# Patient Record
Sex: Female | Born: 1950
Health system: Southern US, Community
[De-identification: ages and names within clinical notes are randomized; demographics above are authoritative.]

## PROBLEM LIST (undated history)

## (undated) DIAGNOSIS — E079 Disorder of thyroid, unspecified: Secondary | ICD-10-CM

## (undated) DIAGNOSIS — M1990 Unspecified osteoarthritis, unspecified site: Secondary | ICD-10-CM

## (undated) DIAGNOSIS — N189 Chronic kidney disease, unspecified: Secondary | ICD-10-CM

## (undated) DIAGNOSIS — I10 Essential (primary) hypertension: Secondary | ICD-10-CM

## (undated) DIAGNOSIS — F32A Depression, unspecified: Secondary | ICD-10-CM

## (undated) DIAGNOSIS — F329 Major depressive disorder, single episode, unspecified: Secondary | ICD-10-CM

## (undated) DIAGNOSIS — H269 Unspecified cataract: Secondary | ICD-10-CM

## (undated) DIAGNOSIS — E785 Hyperlipidemia, unspecified: Secondary | ICD-10-CM

## (undated) HISTORY — DX: Unspecified cataract: H26.9

## (undated) HISTORY — DX: Essential (primary) hypertension: I10

## (undated) HISTORY — DX: Depression, unspecified: F32.A

## (undated) HISTORY — DX: Chronic kidney disease, unspecified: N18.9

## (undated) HISTORY — DX: Disorder of thyroid, unspecified: E07.9

## (undated) HISTORY — PX: OTHER SURGICAL HISTORY: SHX169

## (undated) HISTORY — DX: Hyperlipidemia, unspecified: E78.5

## (undated) HISTORY — DX: Unspecified osteoarthritis, unspecified site: M19.90

## (undated) HISTORY — PX: CHOLECYSTECTOMY: SHX55

---
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
## 2003-12-22 ENCOUNTER — Encounter: Admission: RE | Admit: 2003-12-22 | Discharge: 2003-12-22 | Payer: Self-pay | Admitting: Family Medicine

## 2003-12-30 ENCOUNTER — Encounter: Admission: RE | Admit: 2003-12-30 | Discharge: 2003-12-30 | Payer: Self-pay | Admitting: Sports Medicine

## 2004-01-30 ENCOUNTER — Other Ambulatory Visit: Admission: RE | Admit: 2004-01-30 | Discharge: 2004-01-30 | Payer: Self-pay | Admitting: Family Medicine

## 2004-01-30 ENCOUNTER — Encounter (INDEPENDENT_AMBULATORY_CARE_PROVIDER_SITE_OTHER): Payer: Self-pay | Admitting: Specialist

## 2004-01-30 ENCOUNTER — Encounter: Admission: RE | Admit: 2004-01-30 | Discharge: 2004-01-30 | Payer: Self-pay | Admitting: Sports Medicine

## 2004-03-04 ENCOUNTER — Encounter: Admission: RE | Admit: 2004-03-04 | Discharge: 2004-03-04 | Payer: Self-pay | Admitting: Sports Medicine

## 2004-11-19 ENCOUNTER — Ambulatory Visit: Payer: Self-pay | Admitting: Family Medicine

## 2004-11-25 ENCOUNTER — Encounter: Admission: RE | Admit: 2004-11-25 | Discharge: 2004-12-29 | Payer: Self-pay

## 2004-12-15 ENCOUNTER — Ambulatory Visit: Payer: Self-pay | Admitting: Family Medicine

## 2004-12-17 ENCOUNTER — Encounter: Admission: RE | Admit: 2004-12-17 | Discharge: 2004-12-17 | Payer: Self-pay | Admitting: Sports Medicine

## 2005-01-10 ENCOUNTER — Encounter (INDEPENDENT_AMBULATORY_CARE_PROVIDER_SITE_OTHER): Payer: Self-pay | Admitting: *Deleted

## 2005-01-10 LAB — CONVERTED CEMR LAB

## 2005-01-13 ENCOUNTER — Encounter (INDEPENDENT_AMBULATORY_CARE_PROVIDER_SITE_OTHER): Payer: Self-pay | Admitting: Specialist

## 2005-01-13 ENCOUNTER — Ambulatory Visit: Payer: Self-pay | Admitting: Family Medicine

## 2005-01-13 ENCOUNTER — Other Ambulatory Visit: Admission: RE | Admit: 2005-01-13 | Discharge: 2005-01-13 | Payer: Self-pay | Admitting: Family Medicine

## 2005-06-13 ENCOUNTER — Ambulatory Visit: Payer: Self-pay | Admitting: Family Medicine

## 2005-07-01 ENCOUNTER — Encounter: Admission: RE | Admit: 2005-07-01 | Discharge: 2005-07-01 | Payer: Self-pay | Admitting: Sports Medicine

## 2005-07-08 ENCOUNTER — Ambulatory Visit (HOSPITAL_COMMUNITY): Admission: RE | Admit: 2005-07-08 | Discharge: 2005-07-08 | Payer: Self-pay | Admitting: Gastroenterology

## 2005-07-26 ENCOUNTER — Ambulatory Visit (HOSPITAL_COMMUNITY): Admission: RE | Admit: 2005-07-26 | Discharge: 2005-07-26 | Payer: Self-pay | Admitting: Gastroenterology

## 2005-10-05 ENCOUNTER — Ambulatory Visit (HOSPITAL_COMMUNITY): Admission: RE | Admit: 2005-10-05 | Discharge: 2005-10-05 | Payer: Self-pay | Admitting: Surgery

## 2005-10-05 ENCOUNTER — Encounter (INDEPENDENT_AMBULATORY_CARE_PROVIDER_SITE_OTHER): Payer: Self-pay | Admitting: Specialist

## 2006-01-25 ENCOUNTER — Ambulatory Visit (HOSPITAL_COMMUNITY): Admission: RE | Admit: 2006-01-25 | Discharge: 2006-01-25 | Payer: Self-pay | Admitting: Gastroenterology

## 2006-01-25 ENCOUNTER — Encounter (INDEPENDENT_AMBULATORY_CARE_PROVIDER_SITE_OTHER): Payer: Self-pay | Admitting: Specialist

## 2006-04-03 ENCOUNTER — Encounter: Admission: RE | Admit: 2006-04-03 | Discharge: 2006-04-03 | Payer: Self-pay | Admitting: Sports Medicine

## 2006-04-03 ENCOUNTER — Ambulatory Visit: Payer: Self-pay | Admitting: Family Medicine

## 2006-04-22 IMAGING — CR DG THORACIC SPINE 3V
3 series · 3 of 3 positions shown · non-contrast
Comparison: none

CLINICAL DATA: Pain.  No trauma. 
 THORACIC SPINE WITH SWIMMER?S ? 3 VIEW:
 Three views of the thoracic spine were obtained.  Only mild degenerative change is present with some anterior osteophyte formation in the mid to lower thoracic spine.  No compression deformity is seen.  Alignment is normal.

[t t-spine a.p.]
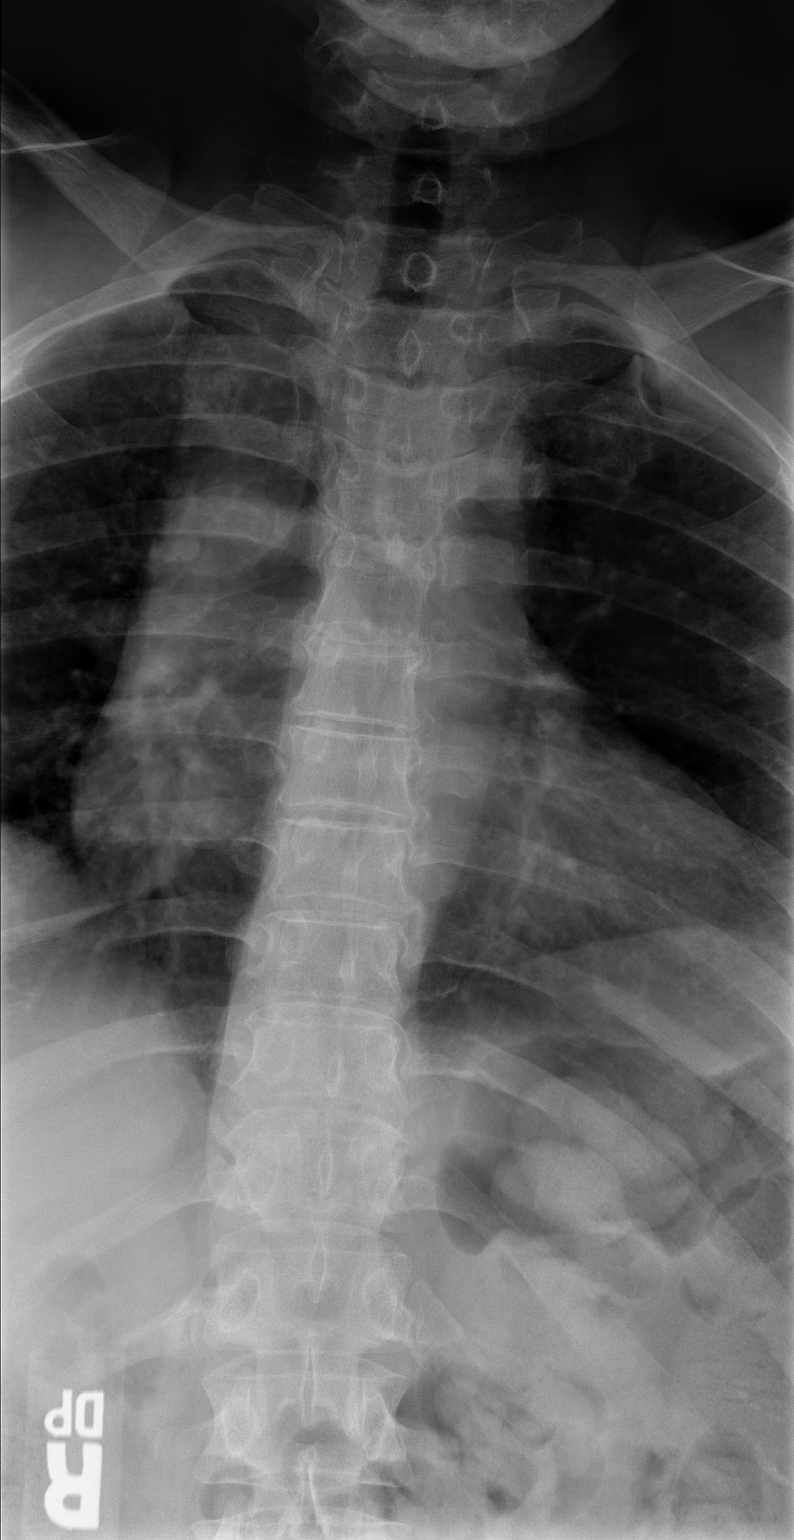

[t t-spine lat]
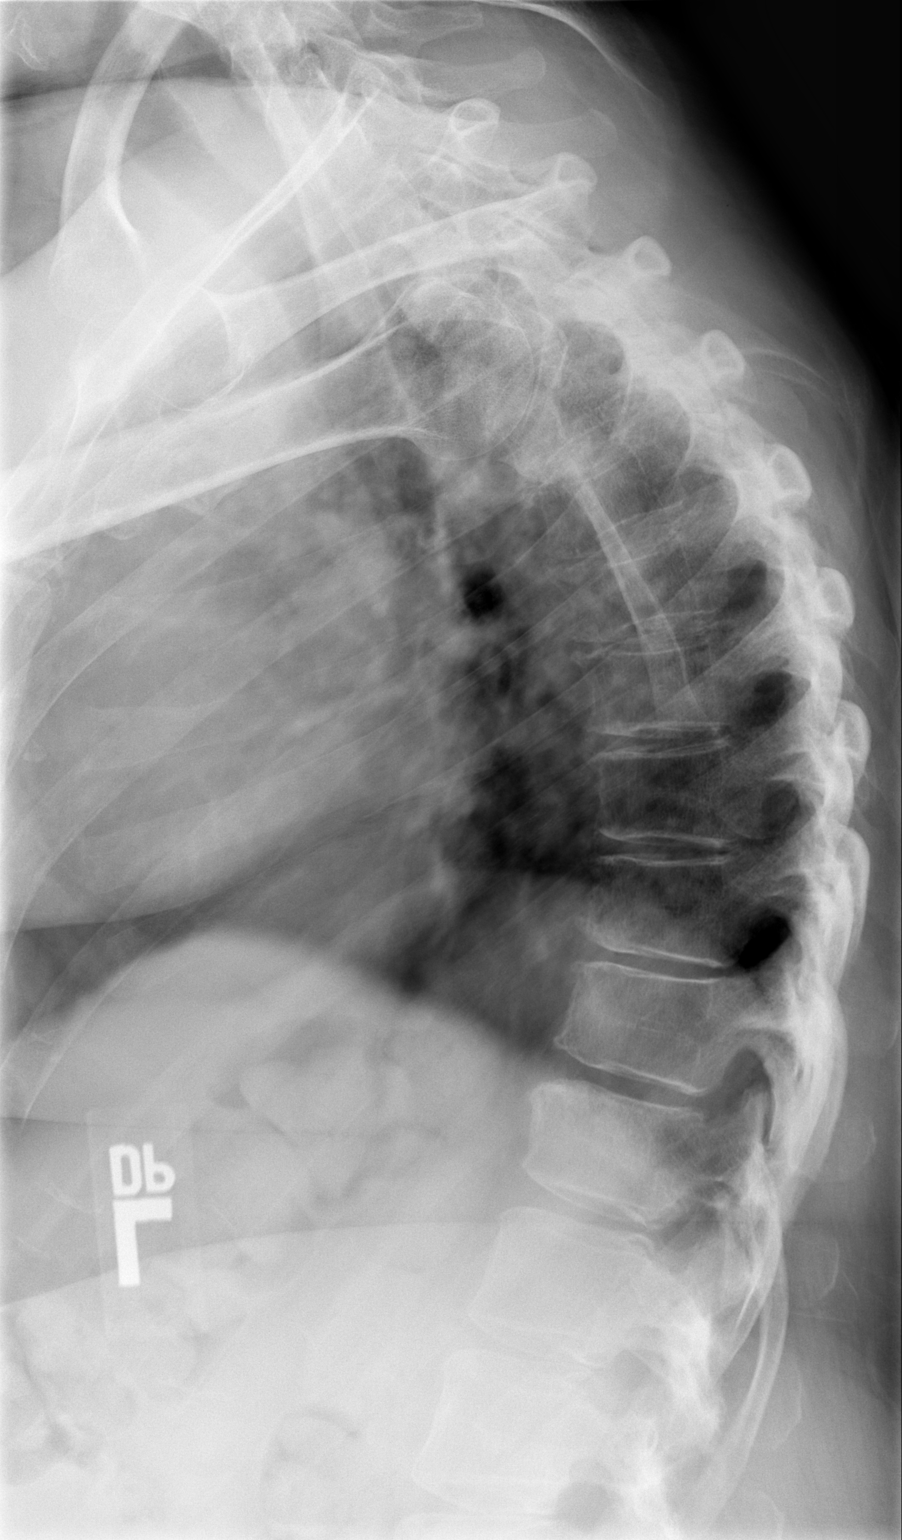

[t swimmers]
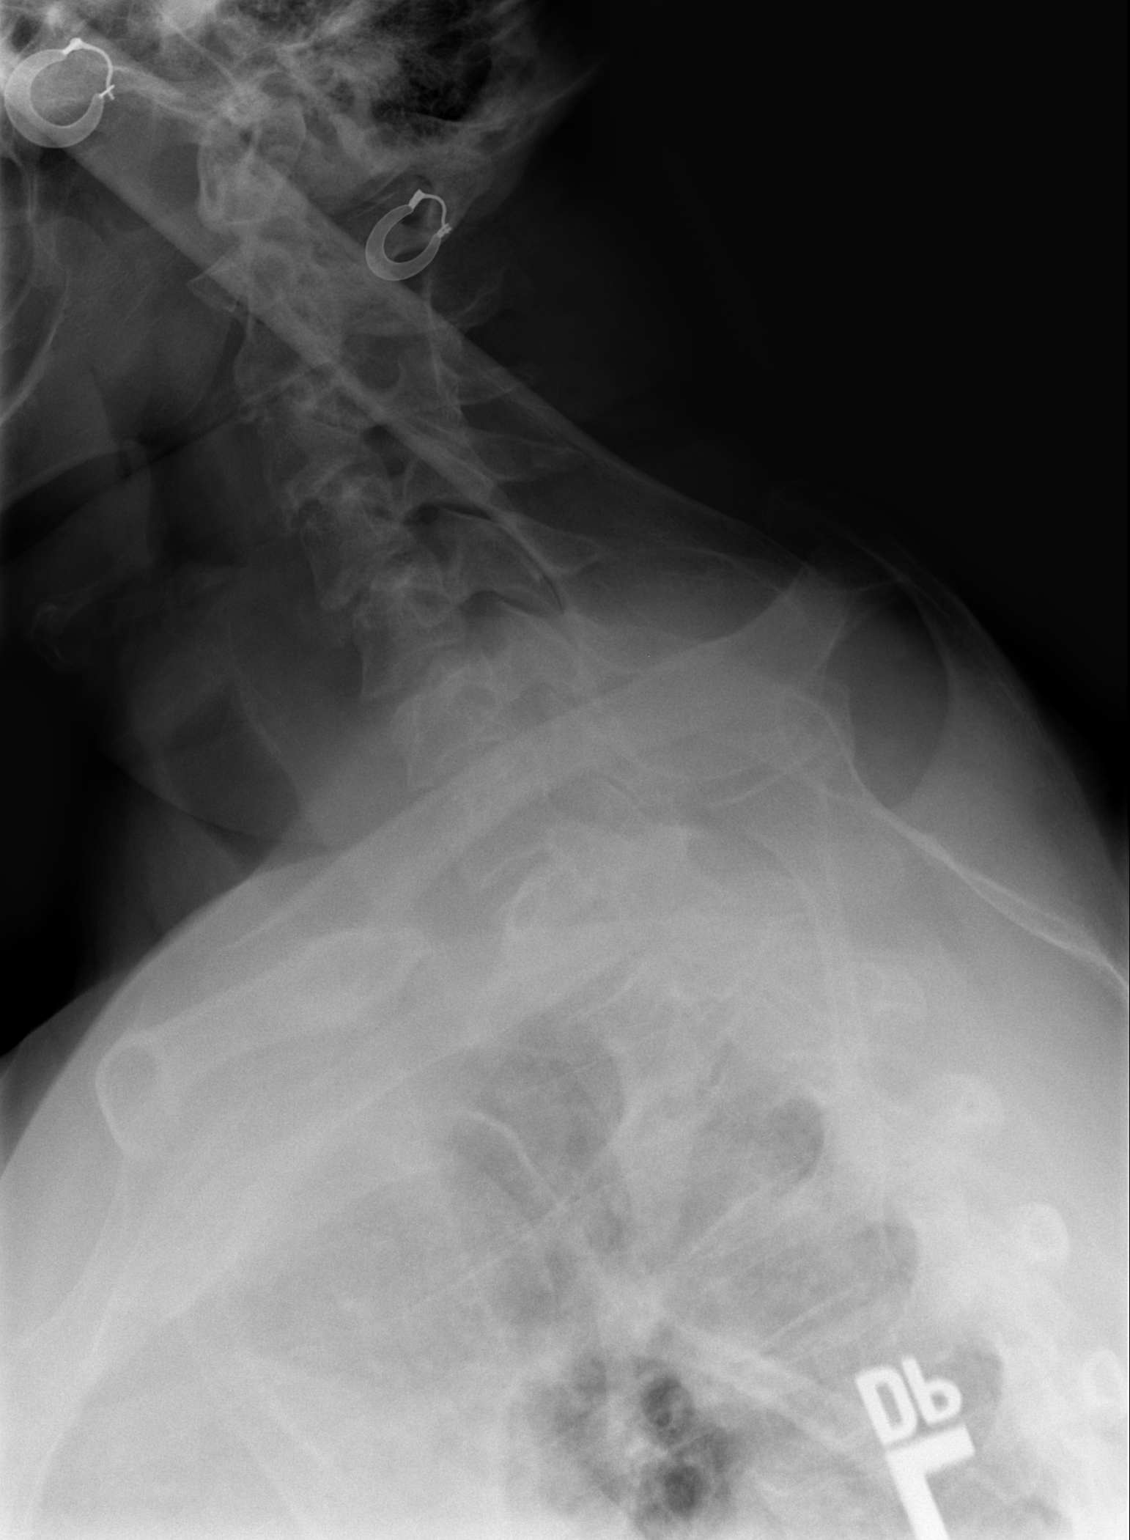

[3 of 3 positions shown; findings below may reference images not displayed]

IMPRESSION: Mild degenerative change.

## 2006-11-09 DIAGNOSIS — E669 Obesity, unspecified: Secondary | ICD-10-CM

## 2006-11-09 DIAGNOSIS — M545 Low back pain, unspecified: Secondary | ICD-10-CM | POA: Insufficient documentation

## 2006-11-09 DIAGNOSIS — F17200 Nicotine dependence, unspecified, uncomplicated: Secondary | ICD-10-CM

## 2006-11-09 DIAGNOSIS — F339 Major depressive disorder, recurrent, unspecified: Secondary | ICD-10-CM

## 2006-11-09 DIAGNOSIS — K219 Gastro-esophageal reflux disease without esophagitis: Secondary | ICD-10-CM

## 2006-11-09 DIAGNOSIS — I10 Essential (primary) hypertension: Secondary | ICD-10-CM

## 2006-11-10 ENCOUNTER — Encounter (INDEPENDENT_AMBULATORY_CARE_PROVIDER_SITE_OTHER): Payer: Self-pay | Admitting: *Deleted

## 2006-11-11 IMAGING — US US ABDOMEN COMPLETE
1 series · 14 of 25 positions shown · non-contrast
Comparison: none

CLINICAL DATA: abdominal pain
 ABDOMEN ULTRASOUND:
TECHNIQUE: Complete abdominal ultrasound examination was performed including evaluation of the liver, gallbladder, bile ducts, pancreas, kidneys, spleen, IVC, and abdominal aorta.

[Series 1: unknown · 0.34mm/px · 14 of 49 slices shown]
[im 1/49]
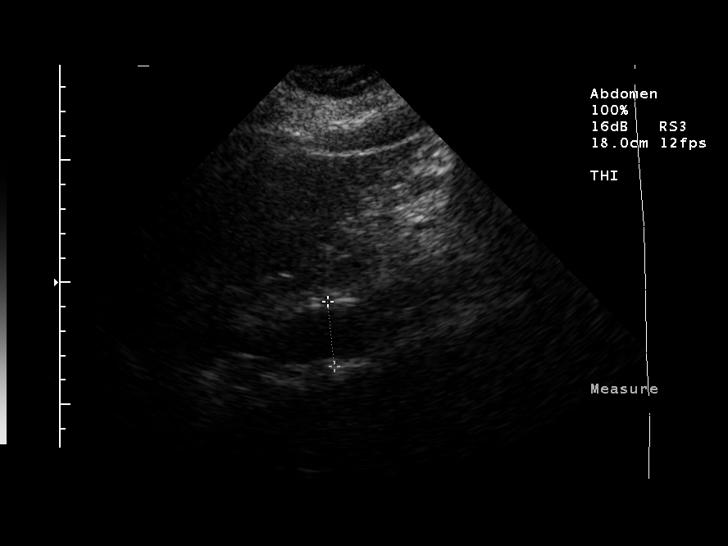
[im 5/49]
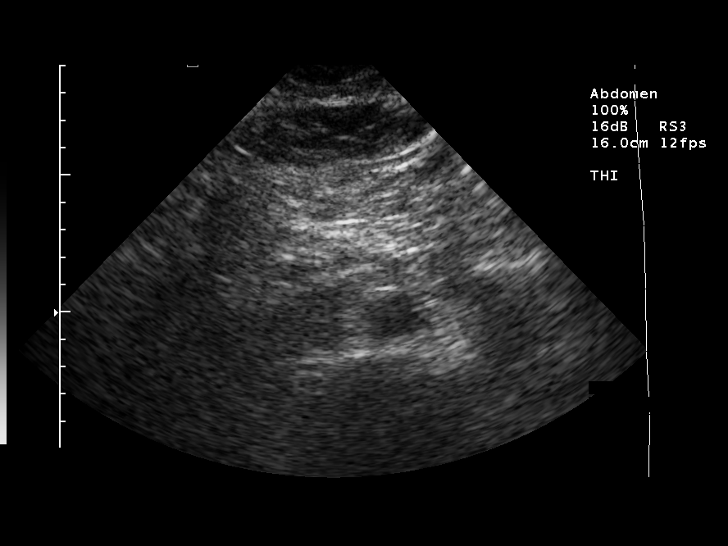
[im 9/49]
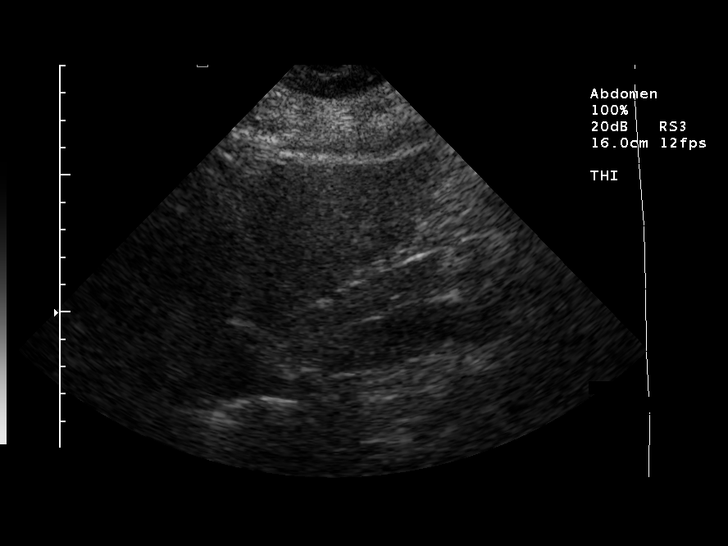
[im 13/49]
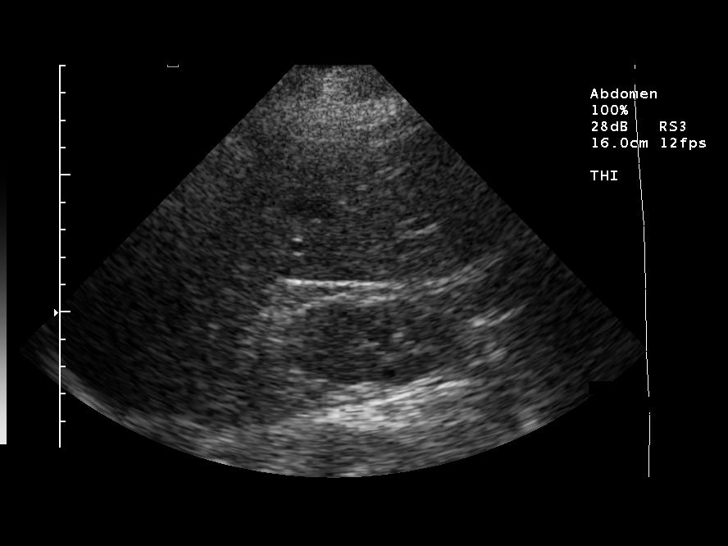
[im 17/49]
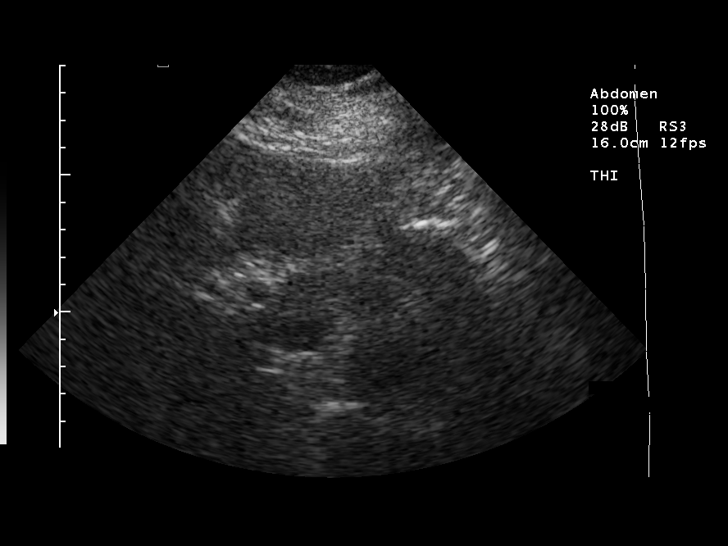
[im 19/49]
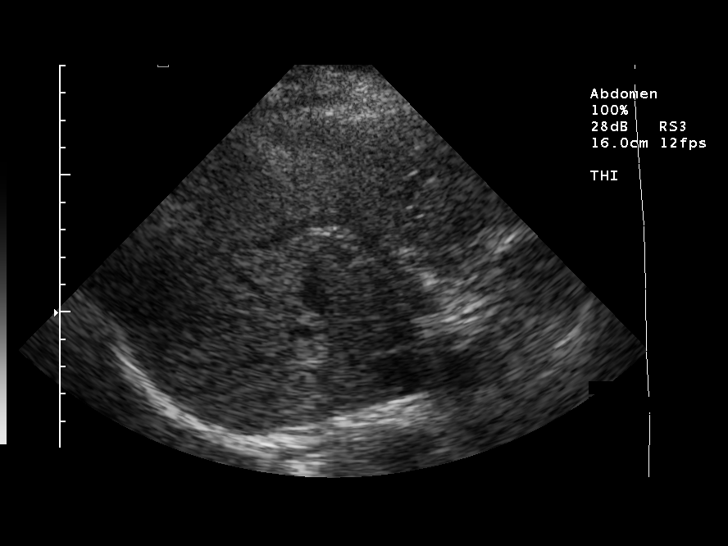
[im 23/49]
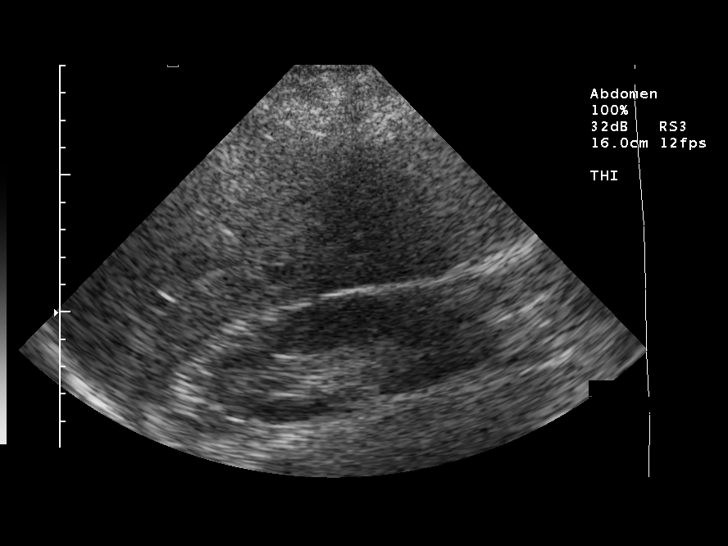
[im 27/49]
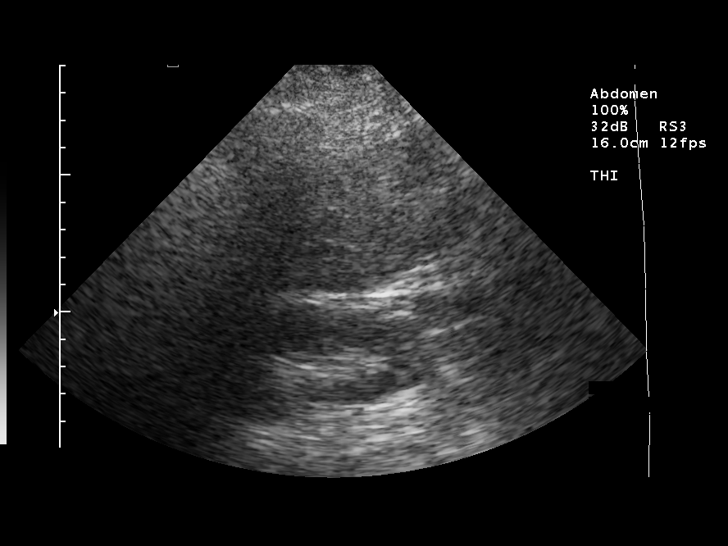
[im 31/49]
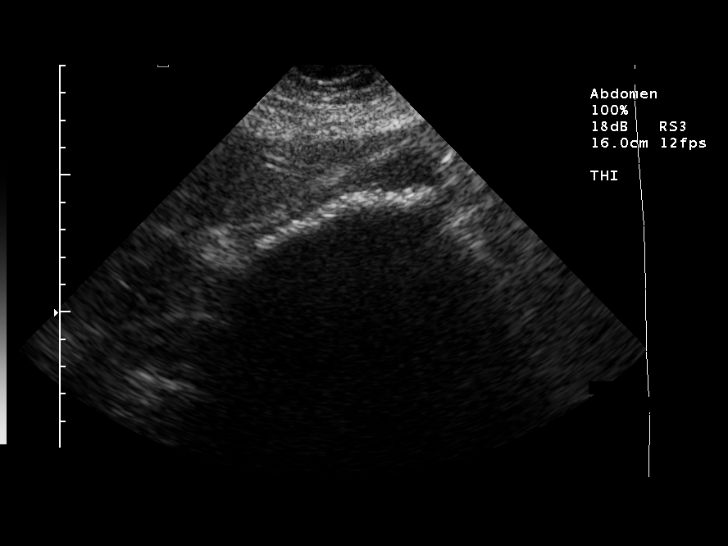
[im 33/49]
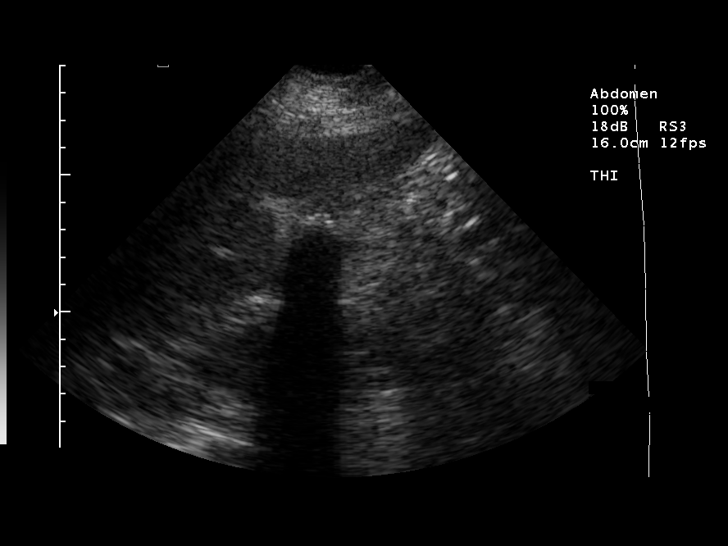
[im 37/49]
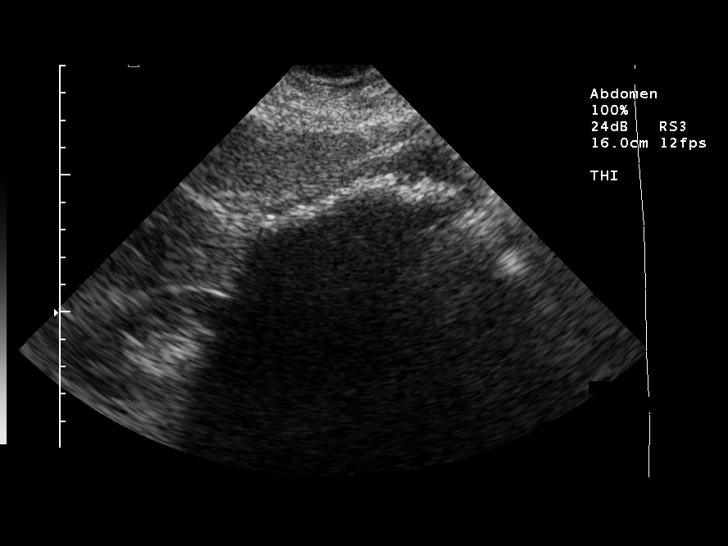
[im 41/49]
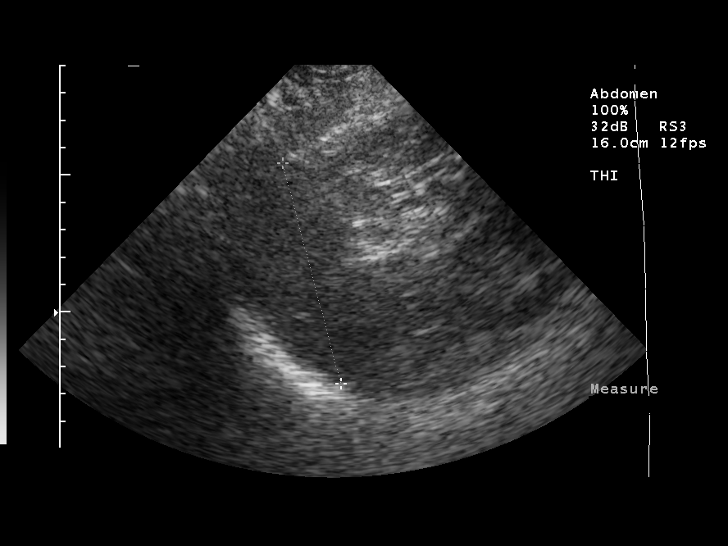
[im 45/49]
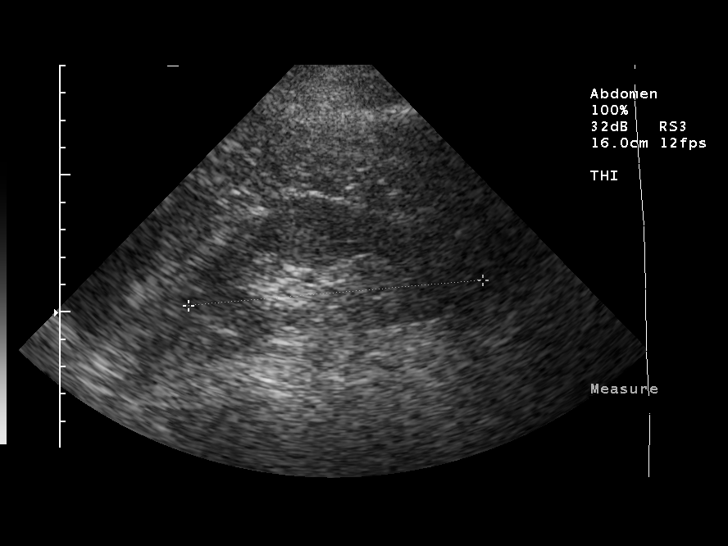
[im 49/49]
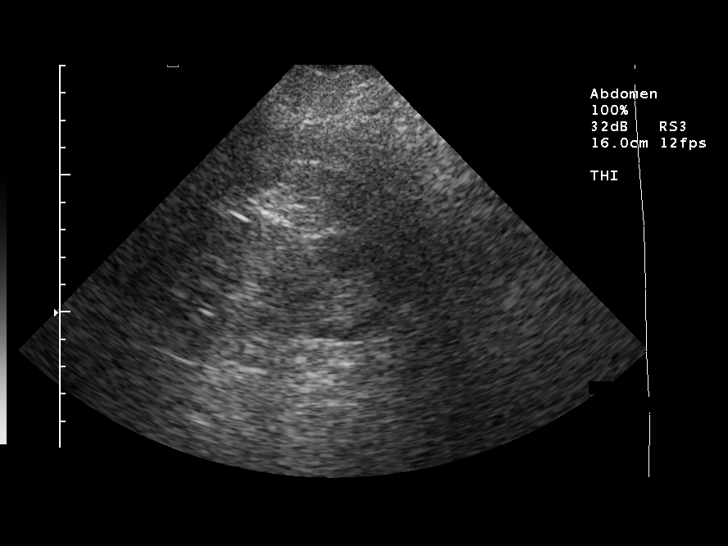

[14 of 25 positions shown; findings below may reference images not displayed]

FINDINGS: Multiple scans of the entire abdomen are made and show the gallbladder to be filled with stones with prominent acoustical shadowing.  Wall thickness is increased and measures 3.2mm.  Common bile duct is slightly prominent at 7.5mm.  The liver and inferior vena cava appear normal.  The pancreas is poorly seen due to large amounts of overlying gas.
 Spleen appears normal and measures 8.3cm in length.  The right kidney is normal and measures 10.9cm.   The left kidney is normal and measures 10.8cm.  
 The aorta measures 2.7cm in maximum diameter and is normal.
IMPRESSION: Gallbladder shows a large number of stones with marked shadowing in the area of the gallbladder.  The gallbladder wall is thickened at 3.2mm.  Common bile duct is somewhat prominent at 7.5mm but no definite stone or other obstruction is seen.  The pancreas is poorly seen due to overlying gas.

## 2007-06-10 ENCOUNTER — Encounter (INDEPENDENT_AMBULATORY_CARE_PROVIDER_SITE_OTHER): Payer: Self-pay | Admitting: Family Medicine

## 2007-06-13 ENCOUNTER — Telehealth (INDEPENDENT_AMBULATORY_CARE_PROVIDER_SITE_OTHER): Payer: Self-pay | Admitting: Family Medicine

## 2007-07-23 ENCOUNTER — Ambulatory Visit: Payer: Self-pay | Admitting: Sports Medicine

## 2007-07-23 ENCOUNTER — Ambulatory Visit (HOSPITAL_COMMUNITY): Admission: RE | Admit: 2007-07-23 | Discharge: 2007-07-23 | Payer: Self-pay | Admitting: Family Medicine

## 2007-07-23 ENCOUNTER — Encounter (INDEPENDENT_AMBULATORY_CARE_PROVIDER_SITE_OTHER): Payer: Self-pay | Admitting: Family Medicine

## 2007-07-23 DIAGNOSIS — R51 Headache: Secondary | ICD-10-CM

## 2007-07-23 DIAGNOSIS — R519 Headache, unspecified: Secondary | ICD-10-CM | POA: Insufficient documentation

## 2007-07-27 DIAGNOSIS — N183 Chronic kidney disease, stage 3 (moderate): Secondary | ICD-10-CM

## 2007-07-27 DIAGNOSIS — N1832 Chronic kidney disease, stage 3b: Secondary | ICD-10-CM | POA: Insufficient documentation

## 2007-07-27 LAB — CONVERTED CEMR LAB
BUN: 22 mg/dL (ref 6–23)
CO2: 27 meq/L (ref 19–32)
Calcium: 9.7 mg/dL (ref 8.4–10.5)
Chloride: 103 meq/L (ref 96–112)
Creatinine, Ser: 2.33 mg/dL — ABNORMAL HIGH (ref 0.40–1.20)
Glucose, Bld: 88 mg/dL (ref 70–99)
Potassium: 3.2 meq/L — ABNORMAL LOW (ref 3.5–5.3)
Sodium: 141 meq/L (ref 135–145)

## 2007-08-03 ENCOUNTER — Encounter (INDEPENDENT_AMBULATORY_CARE_PROVIDER_SITE_OTHER): Payer: Self-pay | Admitting: Family Medicine

## 2007-08-14 ENCOUNTER — Encounter (INDEPENDENT_AMBULATORY_CARE_PROVIDER_SITE_OTHER): Payer: Self-pay | Admitting: Family Medicine

## 2007-08-27 ENCOUNTER — Encounter (INDEPENDENT_AMBULATORY_CARE_PROVIDER_SITE_OTHER): Payer: Self-pay | Admitting: Family Medicine

## 2007-08-27 ENCOUNTER — Ambulatory Visit: Payer: Self-pay | Admitting: Family Medicine

## 2007-08-27 DIAGNOSIS — R002 Palpitations: Secondary | ICD-10-CM | POA: Insufficient documentation

## 2007-08-27 LAB — CONVERTED CEMR LAB
BUN: 19 mg/dL (ref 6–23)
CO2: 26 meq/L (ref 19–32)
Calcium: 9.8 mg/dL (ref 8.4–10.5)
Chloride: 101 meq/L (ref 96–112)
Creatinine, Ser: 1.86 mg/dL — ABNORMAL HIGH (ref 0.40–1.20)
Glucose, Bld: 74 mg/dL (ref 70–99)
Potassium: 3.2 meq/L — ABNORMAL LOW (ref 3.5–5.3)
Sodium: 140 meq/L (ref 135–145)
TSH: 5.999 microintl units/mL — ABNORMAL HIGH (ref 0.350–5.50)

## 2007-09-04 ENCOUNTER — Encounter (INDEPENDENT_AMBULATORY_CARE_PROVIDER_SITE_OTHER): Payer: Self-pay

## 2007-12-07 ENCOUNTER — Encounter (INDEPENDENT_AMBULATORY_CARE_PROVIDER_SITE_OTHER): Payer: Self-pay | Admitting: *Deleted

## 2008-02-11 ENCOUNTER — Telehealth (INDEPENDENT_AMBULATORY_CARE_PROVIDER_SITE_OTHER): Payer: Self-pay | Admitting: Family Medicine

## 2008-03-11 ENCOUNTER — Encounter (INDEPENDENT_AMBULATORY_CARE_PROVIDER_SITE_OTHER): Payer: Self-pay | Admitting: Family Medicine

## 2008-03-11 ENCOUNTER — Ambulatory Visit: Payer: Self-pay | Admitting: Family Medicine

## 2008-03-11 DIAGNOSIS — R21 Rash and other nonspecific skin eruption: Secondary | ICD-10-CM

## 2008-03-11 LAB — CONVERTED CEMR LAB
ALT: 9 units/L (ref 0–35)
AST: 11 units/L (ref 0–37)
Albumin: 4.1 g/dL (ref 3.5–5.2)
Alkaline Phosphatase: 115 units/L (ref 39–117)
BUN: 17 mg/dL (ref 6–23)
CO2: 24 meq/L (ref 19–32)
Calcium: 10.1 mg/dL (ref 8.4–10.5)
Chloride: 105 meq/L (ref 96–112)
Cholesterol: 245 mg/dL — ABNORMAL HIGH (ref 0–200)
Creatinine, Ser: 1.57 mg/dL — ABNORMAL HIGH (ref 0.40–1.20)
Glucose, Bld: 81 mg/dL (ref 70–99)
HDL: 41 mg/dL (ref >39)
LDL Cholesterol: 166 mg/dL — ABNORMAL HIGH (ref 0–99)
Potassium: 3.7 meq/L (ref 3.5–5.3)
Sodium: 142 meq/L (ref 135–145)
TSH: 5.344 microintl units/mL (ref 0.350–5.50)
Total Bilirubin: 0.4 mg/dL (ref 0.3–1.2)
Total CHOL/HDL Ratio: 6
Total Protein: 8.4 g/dL — ABNORMAL HIGH (ref 6.0–8.3)
Triglycerides: 189 mg/dL — ABNORMAL HIGH (ref <150)
VLDL: 38 mg/dL (ref 0–40)

## 2008-03-18 ENCOUNTER — Telehealth: Payer: Self-pay | Admitting: *Deleted

## 2008-03-19 ENCOUNTER — Encounter (INDEPENDENT_AMBULATORY_CARE_PROVIDER_SITE_OTHER): Payer: Self-pay | Admitting: Family Medicine

## 2008-03-20 ENCOUNTER — Telehealth (INDEPENDENT_AMBULATORY_CARE_PROVIDER_SITE_OTHER): Payer: Self-pay | Admitting: Family Medicine

## 2008-03-21 ENCOUNTER — Ambulatory Visit (HOSPITAL_COMMUNITY): Admission: RE | Admit: 2008-03-21 | Discharge: 2008-03-21 | Payer: Self-pay | Admitting: Family Medicine

## 2008-04-02 ENCOUNTER — Encounter (INDEPENDENT_AMBULATORY_CARE_PROVIDER_SITE_OTHER): Payer: Self-pay | Admitting: *Deleted

## 2008-06-12 ENCOUNTER — Encounter (INDEPENDENT_AMBULATORY_CARE_PROVIDER_SITE_OTHER): Payer: Self-pay | Admitting: Family Medicine

## 2008-10-01 ENCOUNTER — Encounter (INDEPENDENT_AMBULATORY_CARE_PROVIDER_SITE_OTHER): Payer: Self-pay | Admitting: Family Medicine

## 2008-10-01 ENCOUNTER — Ambulatory Visit: Payer: Self-pay | Admitting: Family Medicine

## 2008-10-03 ENCOUNTER — Ambulatory Visit (HOSPITAL_COMMUNITY): Admission: RE | Admit: 2008-10-03 | Discharge: 2008-10-03 | Payer: Self-pay | Admitting: Family Medicine

## 2008-10-06 ENCOUNTER — Encounter (INDEPENDENT_AMBULATORY_CARE_PROVIDER_SITE_OTHER): Payer: Self-pay | Admitting: Family Medicine

## 2008-10-06 LAB — CONVERTED CEMR LAB
ALT: 8 units/L (ref 0–35)
AST: 9 units/L (ref 0–37)
Albumin: 3.5 g/dL (ref 3.5–5.2)
Alkaline Phosphatase: 91 units/L (ref 39–117)
BUN: 12 mg/dL (ref 6–23)
CO2: 25 meq/L (ref 19–32)
Calcium: 8.8 mg/dL (ref 8.4–10.5)
Chloride: 102 meq/L (ref 96–112)
Creatinine, Ser: 1.81 mg/dL — ABNORMAL HIGH (ref 0.40–1.20)
Glucose, Bld: 95 mg/dL (ref 70–99)
Potassium: 3.7 meq/L (ref 3.5–5.3)
Sodium: 141 meq/L (ref 135–145)
TSH: 8.512 microintl units/mL — ABNORMAL HIGH (ref 0.350–4.50)
Total Bilirubin: 0.5 mg/dL (ref 0.3–1.2)
Total Protein: 7.6 g/dL (ref 6.0–8.3)

## 2008-10-07 LAB — CONVERTED CEMR LAB
Free T4: 0.84 ng/dL — ABNORMAL LOW (ref 0.89–1.80)
T3, Free: 2.1 pg/mL — ABNORMAL LOW (ref 2.3–4.2)

## 2009-02-06 ENCOUNTER — Ambulatory Visit: Payer: Self-pay | Admitting: Family Medicine

## 2009-02-06 DIAGNOSIS — E039 Hypothyroidism, unspecified: Secondary | ICD-10-CM | POA: Insufficient documentation

## 2009-06-11 ENCOUNTER — Encounter: Payer: Self-pay | Admitting: Family Medicine

## 2009-06-11 ENCOUNTER — Ambulatory Visit: Payer: Self-pay | Admitting: Family Medicine

## 2009-06-11 LAB — CONVERTED CEMR LAB
Free T4: 0.42 ng/dL — ABNORMAL LOW (ref 0.80–1.80)
TSH: 49.3 microintl units/mL — ABNORMAL HIGH (ref 0.350–4.500)

## 2009-06-17 ENCOUNTER — Telehealth: Payer: Self-pay | Admitting: *Deleted

## 2009-07-01 ENCOUNTER — Encounter (INDEPENDENT_AMBULATORY_CARE_PROVIDER_SITE_OTHER): Payer: Self-pay | Admitting: *Deleted

## 2009-09-28 ENCOUNTER — Emergency Department (HOSPITAL_COMMUNITY): Admission: EM | Admit: 2009-09-28 | Discharge: 2009-09-29 | Payer: Self-pay | Admitting: Emergency Medicine

## 2009-09-28 ENCOUNTER — Ambulatory Visit: Payer: Self-pay | Admitting: Family Medicine

## 2009-09-28 DIAGNOSIS — R079 Chest pain, unspecified: Secondary | ICD-10-CM

## 2009-09-28 DIAGNOSIS — E049 Nontoxic goiter, unspecified: Secondary | ICD-10-CM | POA: Insufficient documentation

## 2009-09-29 ENCOUNTER — Encounter: Payer: Self-pay | Admitting: Family Medicine

## 2009-09-29 ENCOUNTER — Encounter (INDEPENDENT_AMBULATORY_CARE_PROVIDER_SITE_OTHER): Payer: Self-pay | Admitting: Emergency Medicine

## 2009-09-29 ENCOUNTER — Ambulatory Visit: Payer: Self-pay | Admitting: Internal Medicine

## 2009-10-02 ENCOUNTER — Encounter: Payer: Self-pay | Admitting: Family Medicine

## 2010-01-25 ENCOUNTER — Encounter: Payer: Self-pay | Admitting: Family Medicine

## 2010-01-29 ENCOUNTER — Encounter: Payer: Self-pay | Admitting: Family Medicine

## 2010-06-18 ENCOUNTER — Encounter: Payer: Self-pay | Admitting: Family Medicine

## 2010-10-02 ENCOUNTER — Encounter: Payer: Self-pay | Admitting: Gastroenterology

## 2010-10-04 ENCOUNTER — Encounter: Payer: Self-pay | Admitting: Family Medicine

## 2010-10-12 NOTE — Miscellaneous (Signed)
  Clinical Lists Changes  Problems: Changed problem from CHRONIC KIDNEY DISEASE UNSPECIFIED (ICD-585.9) to CHRONIC KIDNEY DISEASE STAGE III (MODERATE) (ICD-585.3)

## 2010-10-12 NOTE — Miscellaneous (Signed)
  Clinical Lists Changes  Medications: Changed medication from NEXIUM 40 MG PACK (ESOMEPRAZOLE MAGNESIUM) Take one tablet daily for reflux to NEXIUM 40 MG CPDR (ESOMEPRAZOLE MAGNESIUM) 1 by mouth qd - Signed Rx of NEXIUM 40 MG CPDR (ESOMEPRAZOLE MAGNESIUM) 1 by mouth qd;  #90 x 3;  Signed;  Entered by: Denny Levy MD;  Authorized by: Denny Levy MD;  Method used: Printed then faxed to [facility] [phone] [place], [place], [place]  [address], Ph: [phone], Fax: [phone]    Prescriptions: NEXIUM 40 MG CPDR (ESOMEPRAZOLE MAGNESIUM) 1 by mouth qd  #90 x 3   Entered and Authorized by:   Denny Levy MD   Signed by:   Denny Levy MD on 09/29/2009   Method used:   Printed then faxed to ...       [facility] [address] (retail)       [phone]       [place], [place]  [address]       Ph: [phone]       Fax: [phone]   RxID:   [id number]

## 2010-10-12 NOTE — Miscellaneous (Signed)
Summary: meds  Clinical Lists Changes [facility] called to say the have the triamterine/HCTZ back in stock. pt has not picked up the HCTZ yet. told them to fill the original drug & take the plain HCTZ off the list. FYI to pcp.Golden Circle RN  Jan 29, 2010 9:37 AM

## 2010-10-12 NOTE — Assessment & Plan Note (Signed)
Summary: chest pain, sent to ED   Vital Signs:  Patient profile:   60 year old female Height:      65 inches Weight:      229 pounds BMI:     38.25 BSA:     2.10 Temp:     98.4 degrees F Pulse rate:   86 / minute BP sitting:   125 / 85  Vitals Entered By: Jone Baseman CMA (September 28, 2009 10:14 AM) CC: Chest pain Is Patient Diabetic? No Pain Assessment Patient in pain? yes     Location: left shoulder Intensity: 3 Type: tightness   Primary Care Provider:  Ellery Plunk MD  CC:  Chest pain.  History of Present Illness: Pt presented for f/u of hypothyroidism last seen in October.  Pt never returned after last apt for f/u TSH.  She reports her neck mass/ goiter is getting smaller.  On ROS, pt reports chest pain that began on THursday or Friday.  She describes it as someone sitting on her chest, and a 3/10 intensity.  SHe denies any exertion that brought it on and it was unrelieved by rest.  When asked if she was emotional or stressed at the time, she reported that her house is always very stressful.  She said that it happened while she was cooking dinner.  She reports that the pain was worst on the first day but has continued and is now a 1/10 and still a pressure sensation.  She denies pain with inspiration, any SOB, any dizziness, or any vision changes.    Habits & Providers  Alcohol-Tobacco-Diet     Tobacco Status: current     Tobacco Counseling: to quit use of tobacco products     Cigarette Packs/Day: 0.5  Current Medications (verified): 1)  Triamterene-Hctz 37.5-25 Mg Caps (Triamterene-Hctz) .... Take 1 Capsule By Mouth Once A Day. 2)  Effexor Xr 75 Mg  Cp24 (Venlafaxine Hcl) .... Take One Tablet Po Daily 3)  Nexium 40 Mg Pack (Esomeprazole Magnesium) .... Take One Tablet Daily For Reflux 4)  Synthroid 100 Mcg Tabs (Levothyroxine Sodium) .... Take One Tablet Daily For Thyroid and Come Back in 6-8 Weeks To Have Labs Rechecked. 5)  Lipitor 10 Mg Tabs (Atorvastatin  Calcium) .... Take One Tablet By Mouth Q Hs For Cholesterol 6)  Lotrimin Af 1 % Crea (Clotrimazole) .... Apply To Affected Areas of Face Two Times A Day For 3 Weeks  Allergies (verified): No Known Drug Allergies  Past History:  Past Medical History: h/o abnl pap Renal insufficiency 11/08 Cr 2.33 HTN, HL, Obesity,   Review of Systems       The patient complains of chest pain.  The patient denies fever, vision loss, syncope, peripheral edema, headaches, and abdominal pain.    Physical Exam  General:  alert, well-developed, cooperative to examination, and overweight-appearing.   Head:  normocephalic and atraumatic.   Eyes:  vision grossly intact, pupils equal, pupils round, and pupils reactive to light.   Neck:  supple, enlarged thyroid gland R>L without palpable nodularity Chest Wall:  non tender to palpation Lungs:  Normal respiratory effort, chest expands symmetrically. Lungs are clear to auscultation, no crackles or wheezes. Heart:  Normal rate and regular rhythm. S1 and S2 normal without gallop, murmur, click, rub or other extra sounds. Abdomen:  Bowel sounds positive,abdomen soft and non-tender without masses, organomegaly or hernias noted. Extremities:  negative homan's sign, no leg swelling, LE symmetric, no edema Neurologic:  alert & oriented X3,  cranial nerves II-XII intact, strength normal in all extremities, sensation intact to light touch, and gait normal.   Psych:  poor historian, flat affect   Impression & Recommendations:  Problem # 1:  CHEST PAIN UNSPECIFIED (ICD-786.50) Assessment New Precepted this case and decided to send to ED for eval.  Got an EKG in clinic which was normal except for previously known PVCs.  With her symptoms, would like to r/o MI vs. PE before sending her home.  Pt in agreement with plan to go to ED.  Called charge nurse to let them know she was coming and our suspicions.  Sent Pt with EKG.  Orders: Miscellaneous Lab Charge-[facility] [id number]) 12  Lead EKG (12 Lead EKG) [facility]- Est  Level 4 ([address])  Problem # 2:  HYPOTHYROIDISM (ICD-244.9) Assessment: Unchanged unable to finish work up for this today.  previously wanted Thyroid uptake scan.  Will have pt return to discuss symptoms, get TSH and decide if imaging is necessary. Her updated medication list for this problem includes:    Synthroid 100 Mcg Tabs (Levothyroxine sodium) .Marland Kitchen... Take one tablet daily for thyroid and come back in 6-8 weeks to have labs rechecked.  Complete Medication List: 1)  Triamterene-hctz 37.5-25 Mg Caps (Triamterene-hctz) .... Take 1 capsule by mouth once a day. 2)  Effexor Xr 75 Mg Cp24 (Venlafaxine hcl) .... Take one tablet po daily 3)  Nexium 40 Mg Pack (Esomeprazole magnesium) .... Take one tablet daily for reflux 4)  Synthroid 100 Mcg Tabs (Levothyroxine sodium) .... Take one tablet daily for thyroid and come back in 6-8 weeks to have labs rechecked. 5)  Lipitor 10 Mg Tabs (Atorvastatin calcium) .... Take one tablet by mouth q hs for cholesterol 6)  Lotrimin Af 1 % Crea (Clotrimazole) .... Apply to affected areas of face two times a day for 3 weeks  Other Orders: TSH-[facility] ([id number]) Prescriptions: SYNTHROID 100 MCG TABS (LEVOTHYROXINE SODIUM) Take one tablet daily for thyroid and come back in 6-8 weeks to have labs rechecked.  #30 x 0   Entered and Authorized by:   Ellery Plunk MD   Signed by:   Ellery Plunk MD on 09/28/2009   Method used:   Faxed to ...       [facility] [address] (retail)       [phone]       [place], [place]  [address]       Ph: [phone]       Fax: [phone]   RxID:   [id number] NEXIUM 40 MG PACK (ESOMEPRAZOLE MAGNESIUM) Take one tablet daily for reflux  #90 x 3   Entered and Authorized by:   Ellery Plunk MD   Signed by:   Ellery Plunk MD on 09/28/2009   Method used:   Faxed to ...       [facility] [address] (retail)       [phone]        [place], [place]  [address]       Ph: [phone]       Fax: [phone]   RxID:   [id number] HYDROXYZINE HCL 25 MG TABS (HYDROXYZINE HCL) take one up to 3 times per day as needed for itching  #60 x 1   Entered and Authorized by:   Ellery Plunk MD   Signed by:   Ellery Plunk MD on 09/28/2009   Method used:  Print then Give to Patient   RxID:   [id number] BACTROBAN 2 % OINT (MUPIROCIN) apply to rash on buttocks two times a day  #75month x 1   Entered and Authorized by:   Ellery Plunk MD   Signed by:   Ellery Plunk MD on 09/28/2009   Method used:   Print then Give to Patient   RxID:   [id number]

## 2010-10-12 NOTE — Miscellaneous (Signed)
  Clinical Lists Changes  Medications: Removed medication of TRIAMTERENE-HCTZ 37.5-25 MG CAPS (TRIAMTERENE-HCTZ) Take 1 capsule by mouth once a day. Added new medication of HYDROCHLOROTHIAZIDE 25 MG TABS (HYDROCHLOROTHIAZIDE) 1 by mouth one time daily - Signed Rx of HYDROCHLOROTHIAZIDE 25 MG TABS (HYDROCHLOROTHIAZIDE) 1 by mouth one time daily;  #30 x 0;  Signed;  Entered by: Paula Compton MD;  Authorized by: Paula Compton MD;  Method used: Electronically to [facility] [phone] [place], [place], [place]  [address], Ph: [phone], Fax: [phone] Orders: Added new Test order of Basic Met-[facility] [id number]) - Signed    Prescriptions: HYDROCHLOROTHIAZIDE 25 MG TABS (HYDROCHLOROTHIAZIDE) 1 by mouth one time daily  #30 x 0   Entered and Authorized by:   Paula Compton MD   Signed by:   Paula Compton MD on 01/25/2010   Method used:   Electronically to        [facility] [address] (retail)       [phone]       [place], [place]  [address]       Ph: [phone]       Fax: [phone]   RxID:   [id number]  Asked by triage nurse to review prescription for Triamterene-HCTZ, after message received from [facility] that this medication is on national manufacturer back-order with no release date.  I recommend HCTZ 25mg  by mouth one time daily alone, with BMet in 2 weeks to check K+ and replete as necessary; to resume Dyazide when available again.  Orders for HCTZ and future orders for BMet placed in computer. Paula Compton MD  Jan 25, 2010 3:38 PM  no working phone number to tell pt of change.Golden Circle RN  Jan 25, 2010 3:53 PM

## 2010-10-12 NOTE — Letter (Signed)
Summary: Generic Letter  [facility]  [address]   [place], [place] [address]   Phone: [phone]  Fax: [phone]    06/18/2010  [name] [address] [place], [place]  [address]  Dear Ms. Dziuba,   I was reviewing your chart and noticed that you were referred to a Kidney doctor by Dr. Jimmey Ralph.  Were you able to make that appt?  If you need another referral, I would be happy to make one.  Please call our office and let us know either way so we can get the records.        Sincerely,   Ellery Plunk MD  Appended Document: Generic Letter mailed

## 2010-11-28 LAB — POCT CARDIAC MARKERS: Myoglobin, poc: 70.9 ng/mL (ref 12–200)

## 2010-11-28 LAB — TROPONIN I: Troponin I: 0.01 ng/mL (ref 0.00–0.06)

## 2010-11-28 LAB — DIFFERENTIAL
Basophils Absolute: 0.1 10*3/uL (ref 0.0–0.1)
Basophils Relative: 1 % (ref 0–1)
Eosinophils Absolute: 0.1 10*3/uL (ref 0.0–0.7)
Eosinophils Relative: 2 % (ref 0–5)
Lymphocytes Relative: 51 % — ABNORMAL HIGH (ref 12–46)
Lymphs Abs: 3.7 10*3/uL (ref 0.7–4.0)
Monocytes Absolute: 0.4 10*3/uL (ref 0.1–1.0)
Monocytes Relative: 5 % (ref 3–12)
Neutro Abs: 3 10*3/uL (ref 1.7–7.7)
Neutrophils Relative %: 41 % — ABNORMAL LOW (ref 43–77)

## 2010-11-28 LAB — CBC
HCT: 38.6 % (ref 36.0–46.0)
Hemoglobin: 12.9 g/dL (ref 12.0–15.0)
MCHC: 33.4 g/dL (ref 30.0–36.0)
MCV: 84.6 fL (ref 78.0–100.0)
Platelets: 192 10*3/uL (ref 150–400)
RBC: 4.56 MIL/uL (ref 3.87–5.11)
RDW: 14.8 % (ref 11.5–15.5)
WBC: 7.3 10*3/uL (ref 4.0–10.5)

## 2010-11-28 LAB — POCT I-STAT, CHEM 8
Hemoglobin: 13.9 g/dL (ref 12.0–15.0)
Potassium: 3.8 mEq/L (ref 3.5–5.1)
Sodium: 140 mEq/L (ref 135–145)
TCO2: 33 mmol/L (ref 0–100)

## 2010-11-28 LAB — CK TOTAL AND CKMB (NOT AT ARMC)
CK, MB: 0.8 ng/mL (ref 0.3–4.0)
Relative Index: INVALID (ref 0.0–2.5)
Total CK: 75 U/L (ref 7–177)

## 2010-11-28 LAB — D-DIMER, QUANTITATIVE: D-Dimer, Quant: 0.86 ug/mL-FEU — ABNORMAL HIGH (ref 0.00–0.48)

## 2010-12-06 ENCOUNTER — Ambulatory Visit (INDEPENDENT_AMBULATORY_CARE_PROVIDER_SITE_OTHER): Payer: Self-pay | Admitting: Family Medicine

## 2010-12-06 ENCOUNTER — Encounter: Payer: Self-pay | Admitting: Family Medicine

## 2010-12-06 VITALS — BP 152/89 | HR 108 | Temp 99.0°F | Wt 239.2 lb

## 2010-12-06 DIAGNOSIS — E039 Hypothyroidism, unspecified: Secondary | ICD-10-CM

## 2010-12-06 DIAGNOSIS — H5210 Myopia, unspecified eye: Secondary | ICD-10-CM

## 2010-12-06 DIAGNOSIS — R51 Headache: Secondary | ICD-10-CM

## 2010-12-06 DIAGNOSIS — E049 Nontoxic goiter, unspecified: Secondary | ICD-10-CM

## 2010-12-06 DIAGNOSIS — F339 Major depressive disorder, recurrent, unspecified: Secondary | ICD-10-CM

## 2010-12-06 DIAGNOSIS — I10 Essential (primary) hypertension: Secondary | ICD-10-CM

## 2010-12-06 MED ORDER — ESOMEPRAZOLE MAGNESIUM 40 MG PO CPDR: 40.0000 mg | DELAYED_RELEASE_CAPSULE | Freq: Every day | ORAL | Status: DC

## 2010-12-06 MED ORDER — TRIAMTERENE-HCTZ 37.5-25 MG PO TABS: 1.0000 | ORAL_TABLET | Freq: Every day | ORAL | Status: DC

## 2010-12-06 MED ORDER — LEVOTHYROXINE SODIUM 100 MCG PO TABS: 100.0000 ug | ORAL_TABLET | Freq: Every day | ORAL | Status: DC

## 2010-12-06 MED ORDER — VENLAFAXINE HCL ER 75 MG PO CP24: 75.0000 mg | ORAL_CAPSULE | Freq: Every day | ORAL | Status: DC

## 2010-12-06 NOTE — Assessment & Plan Note (Signed)
Taking effexor.  Still reporting low energy.  I think thyroid problem is definitely a contributing factor. Will reassess with thyroid treatmetn

## 2010-12-06 NOTE — Assessment & Plan Note (Signed)
Has not been taking synthroid.  Recheck TSH, restart synthroid today.  See back in 6 weeks.  U/s of thyroid

## 2010-12-06 NOTE — Assessment & Plan Note (Signed)
Back of neck headaches.  Likely muscle spasm.  Advised heat, motrin, massage.  Will reeval in 6 weeks

## 2010-12-06 NOTE — Patient Instructions (Signed)
Please come back and see me in 6 weeks I am sending you for imaging of your thyroid gland Please start synthroid i will refill your other meds We will send in a referrral to the eye doctor

## 2010-12-06 NOTE — Assessment & Plan Note (Signed)
Elevated today.  Taking maxzide.  Will recheck in 6 weeks at next visit and consider increase dose.  Recheck BMET today

## 2010-12-06 NOTE — Assessment & Plan Note (Signed)
Hx of myopia.  Has had same glasses for 20 years.  No insurance.  Blurry vision worsening.  Will put in referral for project access

## 2010-12-06 NOTE — Progress Notes (Signed)
  Subjective:    Patient ID: [name], female    DOB: July 16, 1951, 60 y.o.   MRN: [id number]  HPI  Pt presents for BP follow up.  No CP, palpitations.  Reporting dry skin, constipation.  Not taking synthroid medication b/c she thought she was told to stop.  Never had thyroid imaged.  Denies fevers, is having some chills and always feels cold.  Weight gain.    HA- having 1 month of posterior neck pain worse in AM and in afternoon.  Taking motrin BID.  Had migraines but these are different.  No weakness or numbness Not smoking currently Review of Systems See above    Objective:   Physical Exam    Vital signs reviewed General appearance - sluggish with speech and movement,  in no distress and oriented to person, place, and time Chest - clear to auscultation, no wheezes, rales or rhonchi, symmetric air entry, no tachypnea, retractions or cyanosis Heart - normal rate, regular rhythm, normal S1, S2, no murmurs, rubs, clicks or gallops MSK-  Paraspinal muscles and trap muscles tight and tender     Assessment & Plan:

## 2010-12-07 ENCOUNTER — Ambulatory Visit (HOSPITAL_COMMUNITY)
Admission: RE | Admit: 2010-12-07 | Discharge: 2010-12-07 | Disposition: A | Discharge status: Home / Self Care | Payer: Self-pay | Source: Ambulatory Visit | Attending: Family Medicine | Admitting: Family Medicine

## 2010-12-07 DIAGNOSIS — E049 Nontoxic goiter, unspecified: Secondary | ICD-10-CM | POA: Insufficient documentation

## 2010-12-07 LAB — BASIC METABOLIC PANEL
BUN: 18 mg/dL (ref 6–23)
Calcium: 9.3 mg/dL (ref 8.4–10.5)
Creat: 2.06 mg/dL — ABNORMAL HIGH (ref 0.40–1.20)

## 2010-12-07 LAB — TSH: TSH: 19.331 u[IU]/mL — ABNORMAL HIGH (ref 0.350–4.500)

## 2010-12-08 ENCOUNTER — Telehealth: Payer: Self-pay | Admitting: Family Medicine

## 2010-12-08 NOTE — Telephone Encounter (Signed)
I have tried to reach pt to discuss result of ultrasound.  I discussed with Dr. Gery Pray (radiology).  He thinks that this was a benign appearing u/s and typical for a thyroid treated with synthroid.  He did not think further evaluation was necessary.  I would like her to take her synthroid and follow up with me as previously discussed.

## 2011-01-10 ENCOUNTER — Other Ambulatory Visit: Payer: Self-pay

## 2011-01-10 DIAGNOSIS — I10 Essential (primary) hypertension: Secondary | ICD-10-CM

## 2011-01-10 DIAGNOSIS — E039 Hypothyroidism, unspecified: Secondary | ICD-10-CM

## 2011-01-10 LAB — BASIC METABOLIC PANEL
BUN: 18 mg/dL (ref 6–23)
CO2: 26 mEq/L (ref 19–32)
Calcium: 9.7 mg/dL (ref 8.4–10.5)
Chloride: 104 mEq/L (ref 96–112)
Creat: 1.98 mg/dL — ABNORMAL HIGH (ref 0.40–1.20)
Glucose, Bld: 95 mg/dL (ref 70–99)
Potassium: 3.4 mEq/L — ABNORMAL LOW (ref 3.5–5.3)
Sodium: 141 mEq/L (ref 135–145)

## 2011-01-10 LAB — TSH: TSH: 6.555 u[IU]/mL — ABNORMAL HIGH (ref 0.350–4.500)

## 2011-01-10 NOTE — Progress Notes (Signed)
Bmp and tsh done [facility] Nabor Thomann

## 2011-01-13 ENCOUNTER — Telehealth: Payer: Self-pay | Admitting: Family Medicine

## 2011-01-13 ENCOUNTER — Encounter: Payer: Self-pay | Admitting: Family Medicine

## 2011-01-13 NOTE — Telephone Encounter (Signed)
Number is disconnected.  Pt needs to confirm dose of synthroid and will need to increase.  If pt calls in, please have her make appt.  I will send her a letter to this effect.

## 2011-01-14 ENCOUNTER — Telehealth: Payer: Self-pay | Admitting: *Deleted

## 2011-01-14 NOTE — Telephone Encounter (Signed)
Pt has appt @ [facility] for 5.16.12 @ 10:45.  Will mail her letter received from [facility]. Jervon Ream, Maryjo [place]

## 2011-01-28 NOTE — Op Note (Signed)
[name], [name]              ACCOUNT NO.:  [id number]   MEDICAL RECORD NO.:  [id number]          PATIENT TYPE:  AMB   LOCATION:  ENDO                         FACILITY:  [facility]   PHYSICIAN:  Shirley Friar, MDDATE OF BIRTH:  [birth year]   DATE OF PROCEDURE:  07/08/2005  DATE OF DISCHARGE:                                 OPERATIVE REPORT   PROCEDURE:  Colonoscopy.   INDICATIONS FOR PROCEDURE:  Screening.   ANESTHESIA:  Demerol 75 mg, Versed 6 mg.   FINDINGS:  Rectal exam was normal.  The adjustable colonoscope was inserted  and advanced through a poorly prepped colon to the cecum where the ileocecal  valve and appendiceal orifice were identified.  The terminal ileum was  intubated and was normal in appearance.  On careful withdrawal of the  colonoscope, no large lesions were seen but due to the patient's poor prep,  unable to adequately visualize the colon.  The colonoscope was withdrawn and  confirmed the above findings.   ASSESSMENT:  Poorly prepped colon.   PLAN:  Repeat colonoscopy in 4-6 months with one gallon GoLYTELY in order to  adequately visualize the colon.      Shirley Friar, MD  Electronically Signed     VCS/MEDQ  D:  07/08/2005  T:  07/08/2005  Job:  [id number]   cc:   Devra Dopp, MD  Fax: [phone]

## 2011-01-28 NOTE — Op Note (Signed)
[name], [name]              ACCOUNT NO.:  [id number]   MEDICAL RECORD NO.:  [id number]          PATIENT TYPE:  AMB   LOCATION:  DAY                          FACILITY:  [facility]   PHYSICIAN:  Wilmon Arms. Corliss Skains, M.D. DATE OF BIRTH:  [birth year]   DATE OF PROCEDURE:  10/05/2005  DATE OF DISCHARGE:                                 OPERATIVE REPORT   PREOPERATIVE DIAGNOSIS:  Chronic calculus cholecystitis.   POSTOPERATIVE DIAGNOSIS:  Chronic calculus cholecystitis.   PROCEDURE PERFORMED:  Laparoscopic cholecystectomy with intraoperative  cholangiogram   SURGEON:  Dr. Corliss Skains   ASSESSMENT:  Dr. Colin Benton   ANESTHESIA:  General endotracheal.   INDICATIONS:  The patient is a 60 year old female who presented on referral  from Dr. Charlott Rakes with complaints of right upper quadrant pain for  over a year.  This is usually postprandial and is associated with nausea,  bloating, and belching.  This pain radiates into her back.  The patient  underwent an ultrasound of the right upper quadrant which showed a mildly  thickened gallbladder wall with multiple gallstones.  She had another one a  few weeks later which showed no wall thickening but multiple stones.  Her  liver function tests were normal.  She was referred for consultation, and we  recommended laparoscopic cholecystectomy.   DESCRIPTION OF PROCEDURE:  The patient brought to the operating room, placed  in supine position operating table.  After an adequate general endotracheal  anesthesia was obtained, a time-out was taken to ensure the proper patient  and proper procedure.  The patient's abdomen was prepped with Betadine and  draped in a sterile fashion.  A vertical incision was made just below  umbilicus after infiltrating with 0.25% Marcaine.  Dissection was carried  down to the fascia bluntly.  The fascia was divided in vertical fashion and  grasped with Kocher clamps.  The peritoneal cavity was bluntly entered.  A  stay  sutures of 0 Vicryl was placed in circumferential pursestring fashion.  The Hasson cannula was inserted and secured with stay sutures.  Pneumoperitoneum was obtained by insufflating CO2 maintaining maximal  pressure of 15 mmHg.  The patient was positioned in reverse Trendelenburg  position, rotated slightly to her left.  The laparoscope was inserted, and a  quick examination of the abdomen showed no abnormalities.  There was some  omentum adherent to the posterior umbilicus.  No bowel was involved.  A 10  mm port was placed in subxiphoid position.  Two 5 mm ports were placed in  the right upper quadrant.  The gallbladder was grasped with clamps and  elevated over the edge of the liver.  The gallbladder was very large and  obviously contained stones.  There were adhesions to the surface of the  gallbladder.  These were taken down with cautery.  The peritoneum over the  cystic duct was opened.  The cystic duct was circumferentially dissected.  The duct was then ligated distally with a clip.  A small opening was created  on the cystic duct.  A Cook cholangiogram catheter was then inserted through  a stab  incision.  This was threaded into the cystic duct and secured with a  clip.  A cholangiogram was then obtained which showed a small, tortuous  cystic duct but good flow into the proximal and distal biliary tree with no  filling defects.  There was good flow through the duodenum.  The  cholangiogram catheter was then removed.  The cystic duct was then ligated  with clips and divided.  The cystic artery was also ligated with clips and  divided.  Cautery was then used to dissect the gallbladder free from the  liver bed.  Hemostasis was obtained with Bovie cautery.  The gallbladder was  placed in an EndoCatch sack.  The gallbladder fossa was then thoroughly  irrigated with saline.  No further bleeding was noted.  The gallbladder in  the EndoCatch sack was then removed through the umbilical  incision.  The  pursestring suture was used to close the fascia.  The right upper quadrant  was then thoroughly inspected.  Hemostasis was felt to be adequate.  The  cannulas were removed while the pneumoperitoneum was released.  Then 4-0  Monocryl was used to close the skin in subcuticular fashion.  Steri-Strips  and clean dressings were applied.  All sponge, instrument, and needle counts  were correct.  The patient was extubated and brought to recovery in stable  condition.      Wilmon Arms. Tsuei, M.D.  Electronically Signed     MKT/MEDQ  D:  10/05/2005  T:  10/06/2005  Job:  [id number]   cc:   Erin Friar, MD  Fax: [phone]

## 2011-01-28 NOTE — Op Note (Signed)
[name], [name]              ACCOUNT NO.:  [id number]   MEDICAL RECORD NO.:  [id number]           PATIENT TYPE:   LOCATION:                                 FACILITY:   PHYSICIAN:  Shirley Friar, MD     DATE OF BIRTH:   DATE OF PROCEDURE:  01/25/2006  DATE OF DISCHARGE:                                 OPERATIVE REPORT   PROCEDURE:  Colonoscopy   INDICATIONS:  Screening.   MEDICATIONS:  Fentanyl 50 mcg IV, Versed 5 mg IV.   HISTORY:  A 60 year old black female who had a colonoscopy for screening  purposes in October, but had poor prep and was scheduled, again, for repeat  colonoscopy to reevaluate her colon.   FINDINGS:  Rectal exam was normal.   DESCRIPTION OF PROCEDURE:  An adult adjustable colonoscope was inserted in a  fair-prepped colon and advance into the cecum where the ileocecal valve and  appendiceal orifice were identified.  There were scattered areas of semi-  solid and liquid stool throughout her colon that had to be irrigated and  aspirated multiple times, although it may have precluded visualization of  small polyps.  An 8-mm colon polyp was noted in the descending colon.  It  was removed in piecemeal fashion with snare cautery.  This polyp was a  sessile polyp.  No other polyps or lesions were seen.  Retroflexion was  normal.   ASSISTANT:  1.  Colon polyp--status post snare chronic snare cautery.  2.  No other abnormalities seen.   PLAN:  1.  Repeat colonoscopy in 3-5 years due to polyp and fair-prep.  2.  High fiber diet.  3.  Avoid aspirin products for 2 weeks.      Shirley Friar, MD  Electronically Signed     VCS/MEDQ  D:  01/25/2006  T:  01/25/2006  Job:  [id number]

## 2011-02-01 IMAGING — CR DG CHEST 2V
2 series · 2 of 2 positions shown · non-contrast
Comparison: Chest radiograph 10/05/2005

CLINICAL DATA: Chest pain

CHEST - 2 VIEW

[w chest pa]
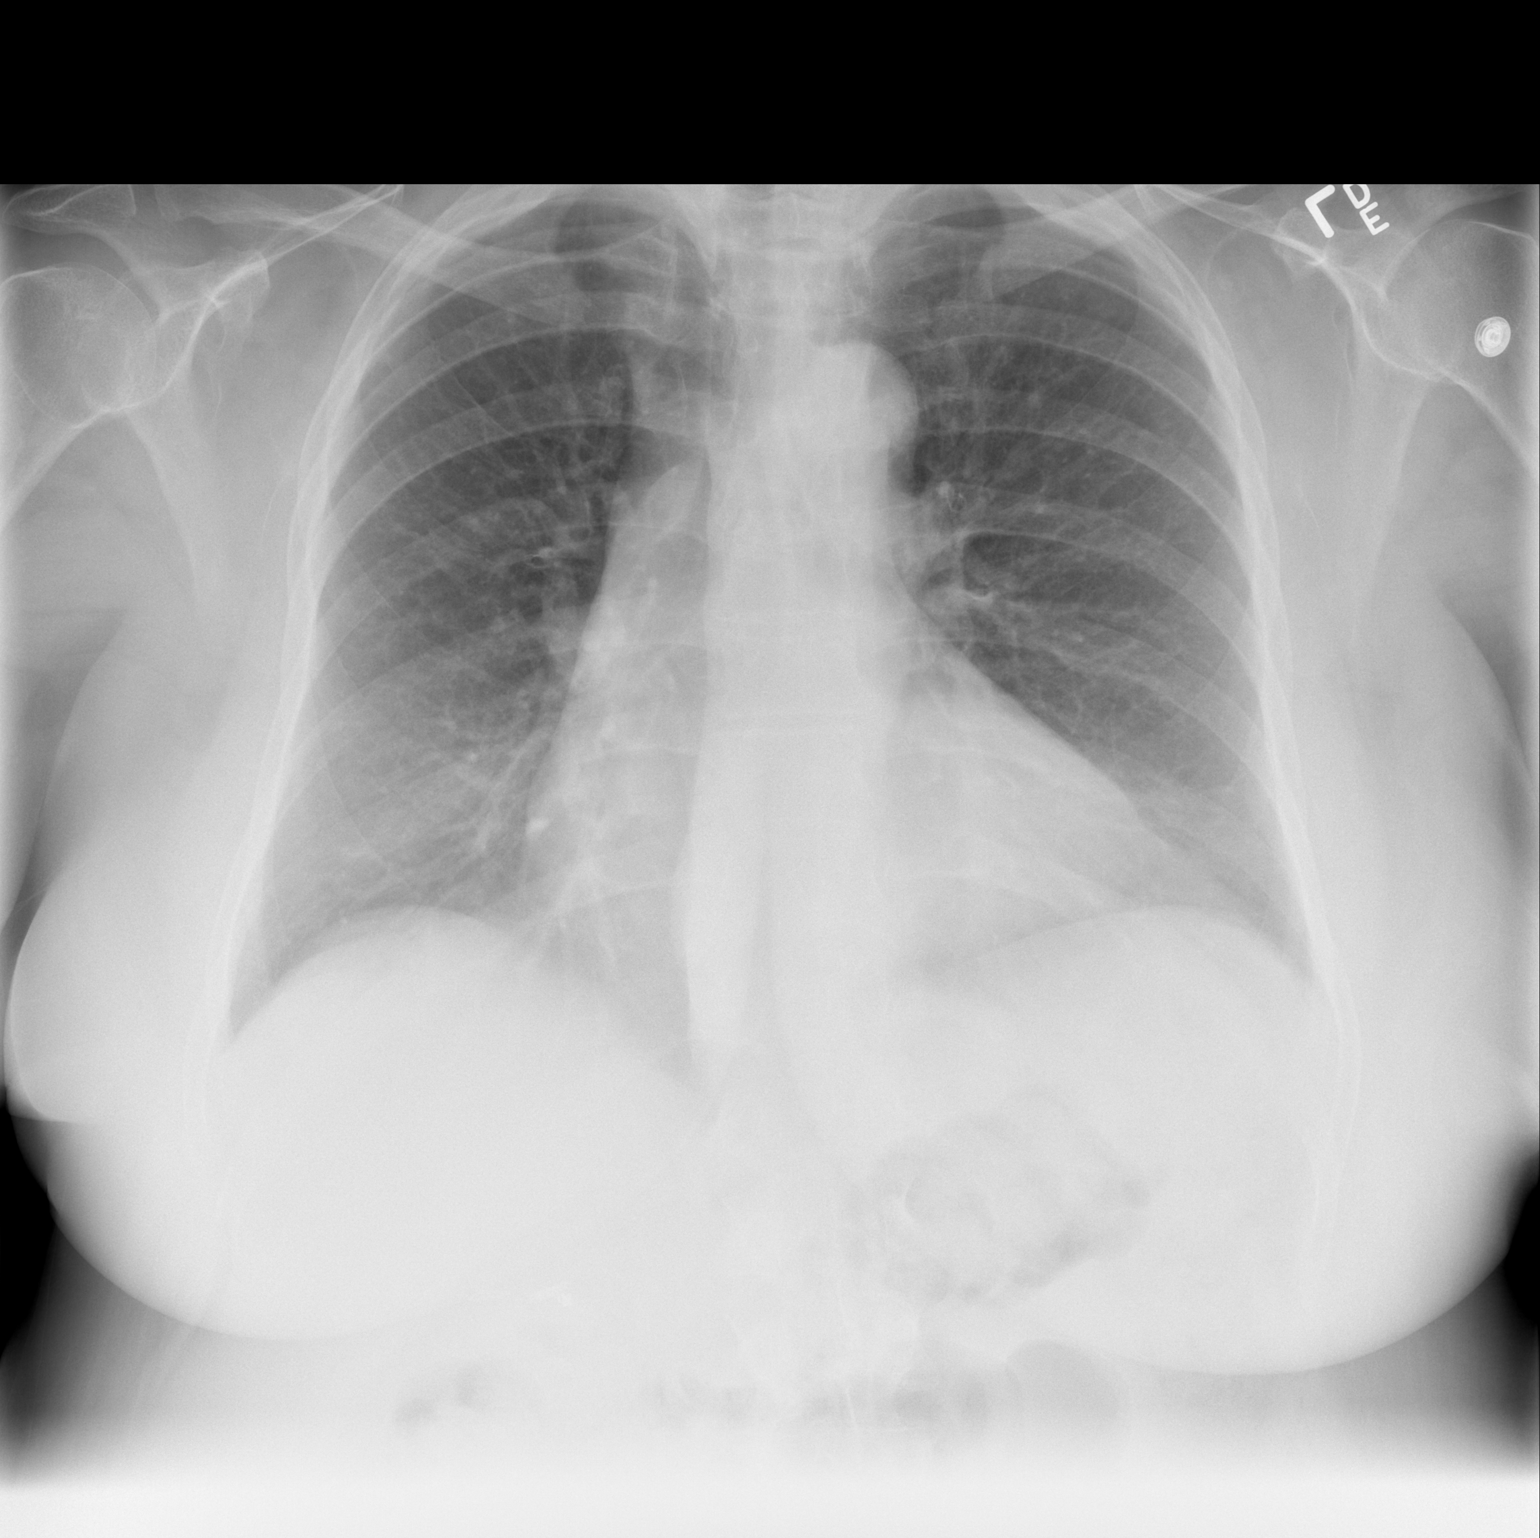

[w chest lat]
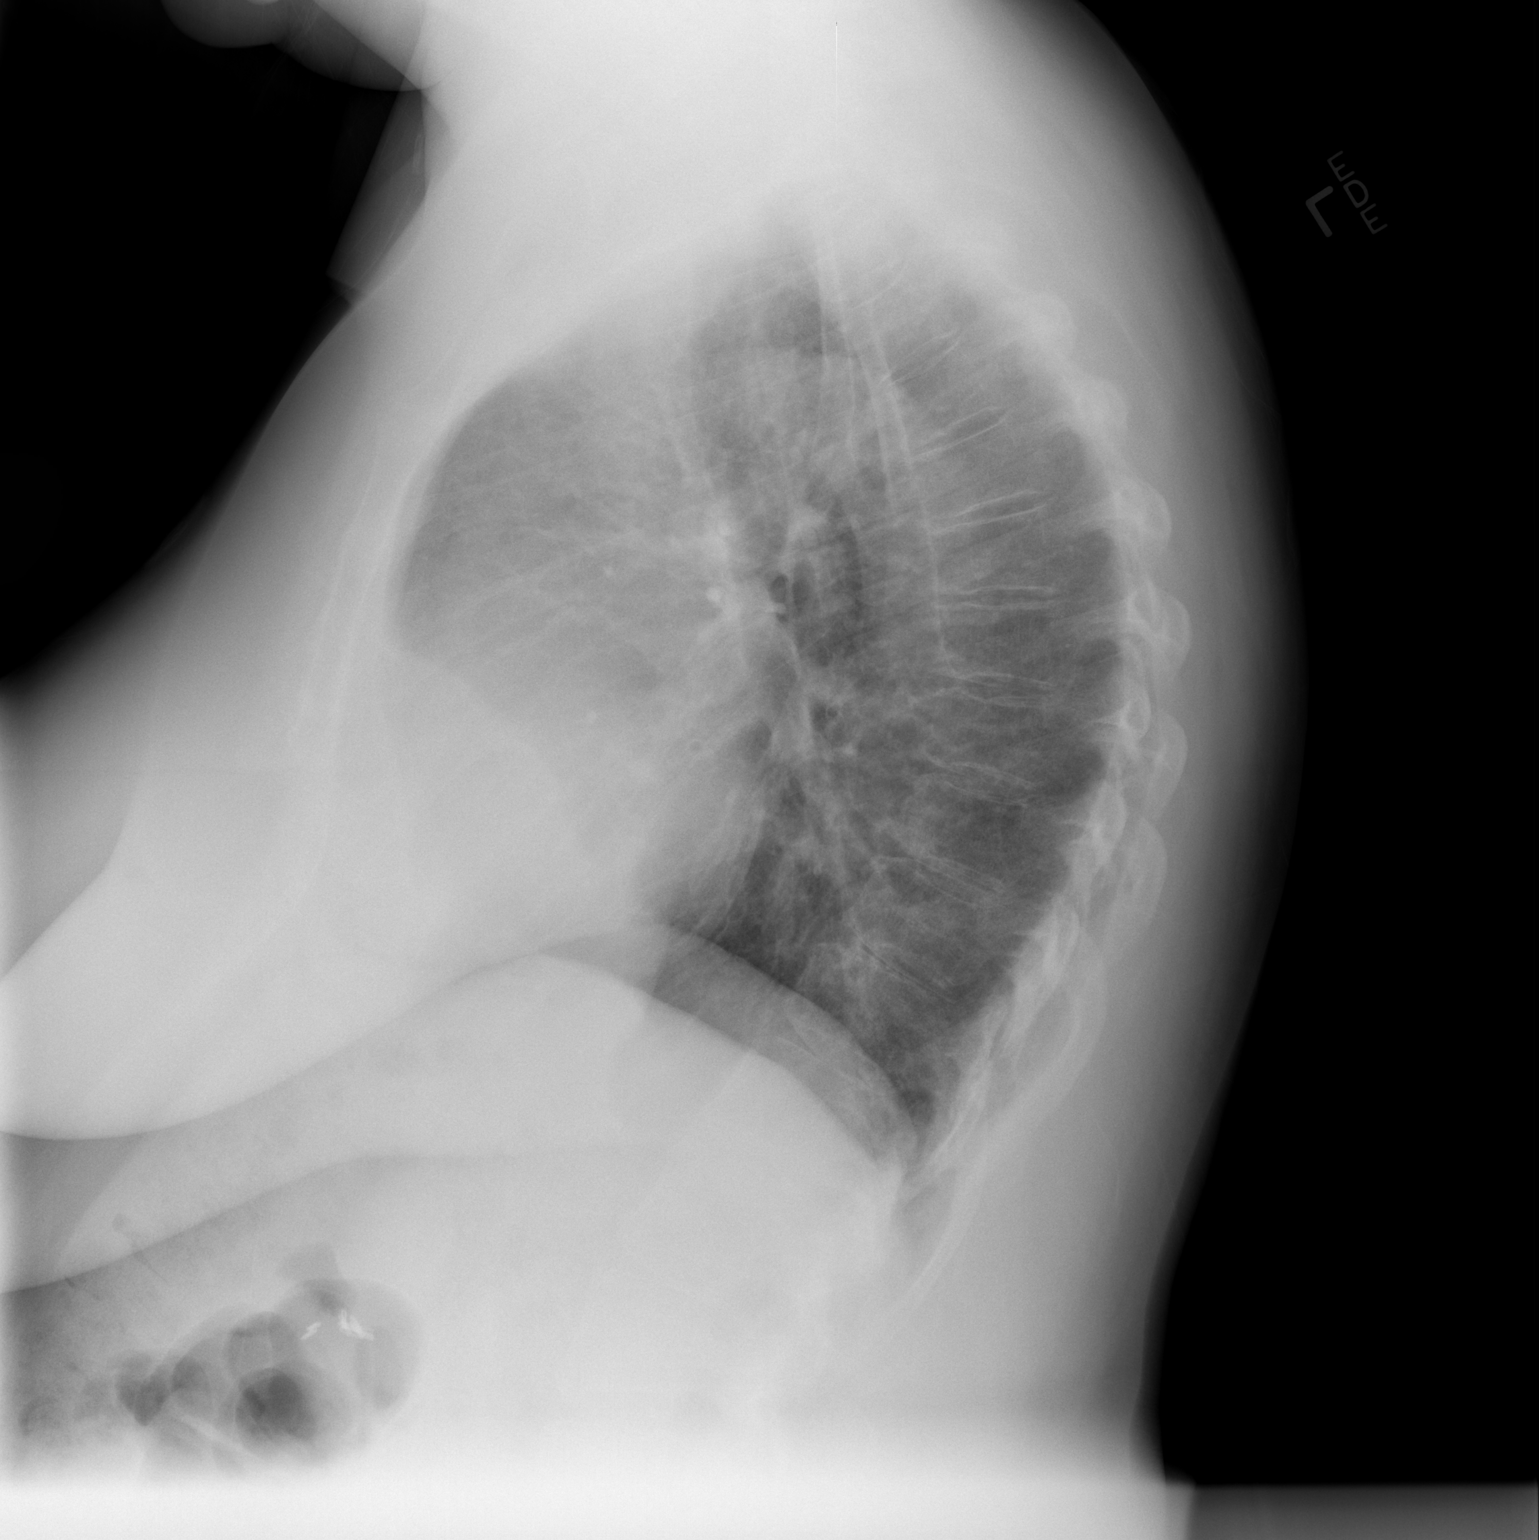

[2 of 2 positions shown; findings below may reference images not displayed]

FINDINGS: Normal mediastinum and heart silhouette.  Costophrenic
angles are clear.  No evidence effusion, infiltrate, or
pneumothorax.
IMPRESSION: No acute cardiopulmonary process.

## 2011-04-08 ENCOUNTER — Other Ambulatory Visit: Payer: Self-pay | Admitting: Family Medicine

## 2011-04-08 MED ORDER — VENLAFAXINE HCL ER 75 MG PO CP24: 75.0000 mg | ORAL_CAPSULE | Freq: Every day | ORAL | Status: DC

## 2011-05-04 ENCOUNTER — Other Ambulatory Visit: Payer: Self-pay | Admitting: Family Medicine

## 2011-05-04 MED ORDER — ATORVASTATIN CALCIUM 10 MG PO TABS: 10.0000 mg | ORAL_TABLET | Freq: Every day | ORAL | Status: DC

## 2011-05-13 ENCOUNTER — Other Ambulatory Visit: Payer: Self-pay | Admitting: Family Medicine

## 2011-05-13 MED ORDER — ATORVASTATIN CALCIUM 10 MG PO TABS: 10.0000 mg | ORAL_TABLET | Freq: Every day | ORAL | Status: DC

## 2011-11-28 ENCOUNTER — Other Ambulatory Visit: Payer: Self-pay | Admitting: Family Medicine

## 2011-11-28 MED ORDER — TRIAMTERENE-HCTZ 37.5-25 MG PO TABS: 1.0000 | ORAL_TABLET | Freq: Every day | ORAL | Status: DC

## 2012-04-03 ENCOUNTER — Other Ambulatory Visit: Payer: Self-pay | Admitting: Family Medicine

## 2012-04-03 MED ORDER — VENLAFAXINE HCL ER 75 MG PO CP24: 75.0000 mg | ORAL_CAPSULE | Freq: Every day | ORAL | Status: DC

## 2012-04-03 NOTE — Telephone Encounter (Signed)
Filled and faxed back refill request for Effexor

## 2012-04-10 ENCOUNTER — Other Ambulatory Visit: Payer: Self-pay | Admitting: Family Medicine

## 2012-04-10 MED ORDER — VENLAFAXINE HCL ER 75 MG PO CP24: 75.0000 mg | ORAL_CAPSULE | Freq: Every day | ORAL | Status: DC

## 2012-12-07 ENCOUNTER — Telehealth: Payer: Self-pay | Admitting: Family Medicine

## 2012-12-07 MED ORDER — ESOMEPRAZOLE MAGNESIUM 20 MG PO CPDR: 20.0000 mg | DELAYED_RELEASE_CAPSULE | Freq: Every day | ORAL | Status: DC

## 2012-12-07 NOTE — Telephone Encounter (Signed)
Called patient. Refilled nexium.  decreased to 20 mg daily. 30 with one refill. Patient will get f/u appt with me.   rx called into [facility] pharmacy.

## 2013-01-02 ENCOUNTER — Ambulatory Visit: Payer: No Typology Code available for payment source | Admitting: Family Medicine

## 2013-01-03 ENCOUNTER — Ambulatory Visit (INDEPENDENT_AMBULATORY_CARE_PROVIDER_SITE_OTHER): Payer: No Typology Code available for payment source | Admitting: Family Medicine

## 2013-01-03 VITALS — BP 163/98 | HR 73 | Temp 98.1°F | Resp 18 | Wt 237.0 lb

## 2013-01-03 DIAGNOSIS — M25369 Other instability, unspecified knee: Secondary | ICD-10-CM | POA: Insufficient documentation

## 2013-01-03 DIAGNOSIS — F17200 Nicotine dependence, unspecified, uncomplicated: Secondary | ICD-10-CM

## 2013-01-03 DIAGNOSIS — I10 Essential (primary) hypertension: Secondary | ICD-10-CM

## 2013-01-03 DIAGNOSIS — M25361 Other instability, right knee: Secondary | ICD-10-CM

## 2013-01-03 DIAGNOSIS — M238X9 Other internal derangements of unspecified knee: Secondary | ICD-10-CM

## 2013-01-03 DIAGNOSIS — F339 Major depressive disorder, recurrent, unspecified: Secondary | ICD-10-CM

## 2013-01-03 DIAGNOSIS — E039 Hypothyroidism, unspecified: Secondary | ICD-10-CM

## 2013-01-03 DIAGNOSIS — L600 Ingrowing nail: Secondary | ICD-10-CM | POA: Insufficient documentation

## 2013-01-03 LAB — TSH: TSH: 15.639 u[IU]/mL — ABNORMAL HIGH (ref 0.350–4.500)

## 2013-01-03 LAB — COMPREHENSIVE METABOLIC PANEL
ALT: <8 U/L (ref 0–35)
CO2: 27 mEq/L (ref 19–32)
Chloride: 108 mEq/L (ref 96–112)
Sodium: 141 mEq/L (ref 135–145)
Total Bilirubin: 0.4 mg/dL (ref 0.3–1.2)
Total Protein: 7.3 g/dL (ref 6.0–8.3)

## 2013-01-03 LAB — LIPID PANEL
Total CHOL/HDL Ratio: 5 Ratio
VLDL: 24 mg/dL (ref 0–40)

## 2013-01-03 MED ORDER — VENLAFAXINE HCL ER 75 MG PO CP24: 75.0000 mg | ORAL_CAPSULE | Freq: Every day | ORAL | Status: DC

## 2013-01-03 MED ORDER — ESOMEPRAZOLE MAGNESIUM 20 MG PO CPDR: 20.0000 mg | DELAYED_RELEASE_CAPSULE | Freq: Every day | ORAL | Status: DC

## 2013-01-03 MED ORDER — TRIAMTERENE-HCTZ 37.5-25 MG PO TABS: 1.0000 | ORAL_TABLET | Freq: Every day | ORAL | Status: DC

## 2013-01-03 MED ORDER — ASPIRIN 81 MG PO TBEC: 81.0000 mg | DELAYED_RELEASE_TABLET | Freq: Every day | ORAL | Status: DC

## 2013-01-03 NOTE — Assessment & Plan Note (Signed)
A: not ready to quit P: counseling provided  

## 2013-01-03 NOTE — Progress Notes (Signed)
Subjective:     Patient ID: [name], female   DOB: Nov 07, 1950, 62 y.o.   MRN: [id number]  HPI  1. HTN: out of meds x one month. Positive SOB and leg swelling. Negative CP, HA, vision changes.   2. Thyroid goiter: not taking thyroid hormone replacement. Cannot remember taking it. Feels compression symptoms when she bends her neck. No trouble swallowing. No change in weight, hair, skin.   3. Smoking: 1/2 PPD. Not ready to quits. Smokes and drinks coffee for stress relief.   4. Ingrown toenail: L great toe. Painful x two weeks.   5. Knees give out: R>L. No pain. Fell two weeks ago. Does not exercise regularly.   Review of Systems As per HPI     Objective:   Physical Exam BP 163/98  Pulse 73  Temp(Src) 98.1 F (36.7 C) (Oral)  Resp 18  Wt 237 lb (107.502 kg)  BMI 39.44 kg/m2  SpO2 98% General appearance: alert, cooperative and no distress Neck: no adenopathy, no carotid bruit, no JVD and thyroid: enlarged symmetrically and non tender  Lungs: clear to auscultation bilaterally Heart: regular rate and rhythm, S1, S2 normal, no murmur, click, rub or gallop Extremities: trace edema at feet. diffused toenail blackening and thickening.  L great toenail ingrown.     Assessment:

## 2013-01-03 NOTE — Assessment & Plan Note (Signed)
A: untreated.  P: check TSH and initiate therapy.

## 2013-01-03 NOTE — Assessment & Plan Note (Signed)
A: knee buckling. Suspect weakness P:  Rehab/knee strengthening with thera bands handout provided

## 2013-01-03 NOTE — Assessment & Plan Note (Signed)
A: ingrown P: recommend toenail removal. Patient informed and will schedule procedure if she desires.

## 2013-01-03 NOTE — Assessment & Plan Note (Addendum)
A: declined off meds. P: Restart maxzide Check TSH and CMP F/u in 4 weeks.

## 2013-01-03 NOTE — Patient Instructions (Addendum)
[name],  Thank you for coming in today. I will dose cholesterol medication and thyroid hormone replacement based on your blood work and fax prescription to your pharmacy.  For HTN: restart maxzide. Avoid salt (soup, canned foods, diet soda).   For knee weakness:  strengthening exercises with band.  If you have access: recumbent bike.   F/u with me in 4 weeks for BP check. If you would like your toenail removed schedule a visit for the procedure, separate visit, toenail removal only.   I will call with lab results.   Dr. Armen Pickup

## 2013-01-04 ENCOUNTER — Telehealth: Payer: Self-pay | Admitting: Family Medicine

## 2013-01-04 MED ORDER — LEVOTHYROXINE SODIUM 25 MCG PO TABS: 25.0000 ug | ORAL_TABLET | Freq: Every day | ORAL | Status: DC

## 2013-01-04 MED ORDER — ATORVASTATIN CALCIUM 10 MG PO TABS: 10.0000 mg | ORAL_TABLET | Freq: Every day | ORAL | Status: DC

## 2013-01-04 NOTE — Telephone Encounter (Signed)
Called patient. Unable to leave message. Letter sent.   Reviewed labs, elevated TSH and cholesterol.  Will start levothyroxine 25 mcg daily, recheck in 6 weeks and titrate up based on TSH.  Will start moderate to high intensity statin- lipitor 10mg  .   Faxed Rx to [facility]

## 2013-01-08 ENCOUNTER — Telehealth: Payer: Self-pay | Admitting: Family Medicine

## 2013-01-08 MED ORDER — ROSUVASTATIN CALCIUM 10 MG PO TABS: 10.0000 mg | ORAL_TABLET | Freq: Every day | ORAL | Status: DC

## 2013-01-08 NOTE — Telephone Encounter (Signed)
Received fax from [facility] pharmacy that lipitor no longer available. crestor is available Sent in crestor 10. Tried to call patient, unable to leave VM. Letter sent.

## 2013-01-21 ENCOUNTER — Encounter: Payer: Self-pay | Admitting: Family Medicine

## 2013-01-21 ENCOUNTER — Ambulatory Visit (INDEPENDENT_AMBULATORY_CARE_PROVIDER_SITE_OTHER): Payer: No Typology Code available for payment source | Admitting: Family Medicine

## 2013-01-21 ENCOUNTER — Telehealth: Payer: Self-pay | Admitting: Family Medicine

## 2013-01-21 VITALS — BP 136/89 | HR 92 | Temp 98.3°F | Ht 64.0 in | Wt 230.0 lb

## 2013-01-21 DIAGNOSIS — K219 Gastro-esophageal reflux disease without esophagitis: Secondary | ICD-10-CM

## 2013-01-21 DIAGNOSIS — B351 Tinea unguium: Secondary | ICD-10-CM

## 2013-01-21 DIAGNOSIS — L600 Ingrowing nail: Secondary | ICD-10-CM

## 2013-01-21 MED ORDER — FAMOTIDINE 20 MG PO TABS: 20.0000 mg | ORAL_TABLET | Freq: Two times a day (BID) | ORAL | Status: DC

## 2013-01-21 MED ORDER — ITRACONAZOLE 200 MG PO TABS: 1.0000 | ORAL_TABLET | Freq: Every day | ORAL | Status: AC

## 2013-01-21 MED ORDER — ITRACONAZOLE 200 MG PO TABS: 1.0000 | ORAL_TABLET | Freq: Every day | ORAL | Status: DC

## 2013-01-21 NOTE — Telephone Encounter (Signed)
Called patient Will treat onychomycosis (toenail fungus). Avoid lamisil due to CrCl < 50. Called in itraconazole for 12 week course and changed PPI to H2 blocker.

## 2013-01-21 NOTE — Assessment & Plan Note (Signed)
Will treat. Avoid lamisil due to CrCl < 50. Called in itraconazole for 12 week course and changed PPI to H2 blocker.

## 2013-01-21 NOTE — Progress Notes (Signed)
Patient ID: [name], female   DOB: [birth year], 62 y.o.   MRN: [id number] Toenail Avulsion Procedure Note  Pre-operative Diagnosis: Left Ingrown Great toenail, onychomycosis   Post-operative Diagnosis: Left Ingrown Great toenail, onychomycosis   Indications: pain   Anesthesia: Lidocaine 1% without epinephrine without added sodium bicarbonate  Procedure Details  History of allergy to iodine: no  The risks (including bleeding and infection) and benefits of the  procedure and Written informed consent obtained.  After digital block anesthesia was obtained, a tourniquet was applied for hemostasis during the procedure.  After prepping with Betadine, the lateral and medial  edge of the nail was freed from the nailbed and perionychium, and then split with scissors and removed with  forceps.  All visible granulation tissue is debrided. Antibiotic and bulky dressing was applied.   Findings: In grown toenail   Complications: none.  Plan: 1. Soak the foot once daily. Change dressing and apply antibiotic ointment once  daily until healed over. 2. Warning signs of infection were reviewed.   3. Recommended that the patient use OTC acetaminophen and OTC ibuprofen as needed for pain.  4. Return as needed.

## 2013-01-21 NOTE — Patient Instructions (Addendum)
[name],  Thank you for coming in today. I removed your ingrown toenail on your L great toe.  Apply antibiotic ointment daily until healing is complete.  The patient should be given the instruction sheet and told to take ibuprofen (Motrin) and acetaminophen (Tylenol) for postoperative pain.  Daily cleansing with warm water is encouraged. Avoid  strenuous exercise  for at least one week.  If you develop redness, swelling, foul smell drainage at the removal site please call immediately.   Dr. Armen Pickup   Toenail Removal Toenails may need to be removed because of injury, infections, or to correct abnormal growth. A special non-stick bandage will likely be put tightly on your toe to prevent bleeding. Often times a new nail will grow back. Sometimes the new nail may be deformed. Most of the time when a nail is lost, it will gradually heal, but may be sensitive for a long time. HOME CARE INSTRUCTIONS   Keep your foot elevated to relieve pain and swelling. This will require lying in bed or on a couch with the leg on pillows or sitting in a recliner with the leg up. Walking or letting your leg dangle may increase swelling, slow healing, and cause throbbing pain.  Keep your bandage dry and clean.  Change your bandage in 24 hours.  After your bandage is changed, soak your foot in warm, soapy water for 10 to 20 minutes. Do this 3 times per day. This helps reduce pain and swelling. After soaking your foot apply a clean, dry bandage. Change your bandage if it is wet or dirty.  Only take over-the-counter or prescription medicines for pain, discomfort, or fever as directed by your caregiver.  See your caregiver as needed for problems. You might need a tetanus shot now if:  You have no idea when you had the last one.  You have never had a tetanus shot before.  The injured area had dirt in it. If you need a tetanus shot, and you decide not to get one, there is a rare chance of getting tetanus.  Sickness from tetanus can be serious. If you did get a tetanus shot, your arm may swell, get red and warm to the touch at the shot site. This is common and not a problem. SEEK IMMEDIATE MEDICAL CARE IF:   You have increased pain, swelling, redness, warmth, drainage, or bleeding.  You have a fever.  You have swelling that spreads from your toe into your foot. Document Released: 05/28/2003 Document Revised: 11/21/2011 Document Reviewed: 09/08/2008 [facility] Patient Information 2013 [place], [place].

## 2013-02-01 ENCOUNTER — Ambulatory Visit (INDEPENDENT_AMBULATORY_CARE_PROVIDER_SITE_OTHER): Payer: No Typology Code available for payment source | Admitting: Family Medicine

## 2013-02-01 ENCOUNTER — Encounter: Payer: Self-pay | Admitting: Family Medicine

## 2013-02-01 VITALS — BP 136/76 | HR 86 | Temp 98.9°F | Ht 64.0 in | Wt 225.4 lb

## 2013-02-01 DIAGNOSIS — L600 Ingrowing nail: Secondary | ICD-10-CM

## 2013-02-01 DIAGNOSIS — I10 Essential (primary) hypertension: Secondary | ICD-10-CM

## 2013-02-01 MED ORDER — DOXYCYCLINE HYCLATE 100 MG PO TABS: 100.0000 mg | ORAL_TABLET | Freq: Two times a day (BID) | ORAL | Status: DC

## 2013-02-01 NOTE — Assessment & Plan Note (Signed)
A: s/p removal. Area is healing well. Throbbing and tenderness concerning for post surgical infection. P: Doxycycline x 10 day course  Continue to soak. Apply dressing post soak-band aid is appropriate  F/u in 3 weeks.

## 2013-02-01 NOTE — Progress Notes (Signed)
Subjective:     Patient ID: [name], female   DOB: 23-Sep-1950, 62 y.o.   MRN: [id number]  HPI 62 yo F presents for f/u to discuss the following;  1. S/p L great ingrown toenail removal: 2 week f/u. Patient with throbbing when standing. No fever, significant drainage, redness or swelling. She is soaking the toe in alcohol. She is not applying a dressing. Sh has not started itraconazole for onychomycosis.   2. HTN: compliant with medication. Smoking 1/2 PPD. Denies HA, CP, SOB and vision changes.   Review of Systems As per HPI    Objective:   Physical Exam BP 136/76  Pulse 86  Temp(Src) 98.9 F (37.2 C) (Oral)  Ht 5\' 4"  (1.626 m)  Wt 225 lb 6 oz (102.229 kg)  BMI 38.67 kg/m2 General appearance: alert, cooperative and no distress Lungs: clear to auscultation bilaterally Heart: regular rate and rhythm, S1, S2 normal, no murmur, click, rub or gallop Extremities: extremities normal, atraumatic, no cyanosis or edema, L great toenail tender medially. No swelling or redness. No drainage from lateral or medial wound.     Assessment and Plan:

## 2013-02-01 NOTE — Assessment & Plan Note (Signed)
A: BP at goal. P: continue current regimen.

## 2013-02-01 NOTE — Patient Instructions (Addendum)
[name],  Thank you for coming in today. Your blood pressures is at goal. Please continue maxzide.  For your foot: the wound is healing well. I am concerned about your pain. Please take doxycyline 100 mg twice daily for 10 days. Called in to [facility] dept pharmacy.  Continue to soak your foot in warm, soapy water for 10 to 20 minutes. Do this 3 times per day. This helps reduce pain and swelling. After soaking your foot apply a clean, dry band-aid.   Plan to see me in 3 more weeks for f/u.   Dr. Armen Pickup

## 2013-02-25 ENCOUNTER — Ambulatory Visit (INDEPENDENT_AMBULATORY_CARE_PROVIDER_SITE_OTHER): Payer: No Typology Code available for payment source | Admitting: Family Medicine

## 2013-02-25 VITALS — BP 128/79 | Temp 98.2°F | Wt 226.0 lb

## 2013-02-25 DIAGNOSIS — I10 Essential (primary) hypertension: Secondary | ICD-10-CM

## 2013-02-25 DIAGNOSIS — M542 Cervicalgia: Secondary | ICD-10-CM

## 2013-02-25 DIAGNOSIS — E039 Hypothyroidism, unspecified: Secondary | ICD-10-CM

## 2013-02-25 DIAGNOSIS — F17200 Nicotine dependence, unspecified, uncomplicated: Secondary | ICD-10-CM

## 2013-02-25 DIAGNOSIS — L600 Ingrowing nail: Secondary | ICD-10-CM

## 2013-02-25 MED ORDER — KETOROLAC TROMETHAMINE 60 MG/2ML IM SOLN
60.0000 mg | Freq: Once | INTRAMUSCULAR | Status: AC
  Administered 2013-02-25: 60 mg via INTRAMUSCULAR

## 2013-02-25 NOTE — Patient Instructions (Addendum)
Marcell,  Thank you for coming in today.  Her blood pressure is perfect!   For your neck pain I recommended that you ice it and continue stretches.  Please plan to follow up with me in 2 months after you've started your level thyroxine for thyroid replacement.I will need to recheck your TSH every and treated for at least 8 weeks.  F/u in 2 months.  Dr. Armen Pickup

## 2013-02-25 NOTE — Assessment & Plan Note (Signed)
A: areas feeling well despite the patient not taking the course antibiotics. P:  Patient to follow up as needed

## 2013-02-25 NOTE — Assessment & Plan Note (Signed)
Not ready to quit.  

## 2013-02-25 NOTE — Progress Notes (Signed)
Subjective:     Patient ID: [name], female   DOB: 1951-06-20, 62 y.o.   MRN: [id number]  HPI And 62 year old female presents for follow visit to discuss the following  #1 hypertension: patient is compliant with her hypertensive medication. She continues to smoke a half pack a day. She admits to intermittent headache. She denies chest pain or shortness of breath. She also denies lower extremity edema.  #2 Tobacco abuse: Patient is not ready to quit. She states that smoking is her stress relief.  #3 toenail removal: patient is now 4 weeks out from left great lateral toenails avulsion. Her two-week followup appointment she had some pain and drainage at the site. I prescribed a ten-day course of doxycycline. The patient did not take doxycycline. She reports she has continued intermittent drainage from the medial aspect of her toe. The drainage is clear. She also has occasional sharp pains were toe up to the midfoot.  #4 neck pain:Patient with neck pain for the past week. The pain initially started on the right side is dominant to the left side. The pain is sharp and radiates to left supraspinous area. The pain does not radiate down her arm there is no associated weakness.  Medication review: patient has still not started level thyroxine for hypothyroidism, Crestor, and itraconazole for onychomycosis.   Review of Systems As per HPI     Objective:   Physical Exam BP 128/79  Temp(Src) 98.2 F (36.8 C) (Oral)  Wt 226 lb (102.513 kg)  BMI 38.77 kg/m2 General appearance: alert, cooperative and no distress Neck: supple. muscle spasm noted L posterior neck. pain reproduced with neck flexion.  Lungs: clear to auscultation bilaterally Heart: regular rate and rhythm, S1, S2 normal, no murmur, click, rub or gallop Extremities: extremities normal, atraumatic, no cyanosis or edema. L great toe w/o swelling, drainage or warmth. 2+ DP pulse b/l.     Assessment and Plan:

## 2013-02-25 NOTE — Assessment & Plan Note (Signed)
A: not compliant with medication. P: patient to start medication. To f/u in 8-12 weeks after starting medication to have her TSH rechecked.

## 2013-02-25 NOTE — Assessment & Plan Note (Signed)
A: well controlled. Meds: compliant P: continue current regimen.

## 2013-04-16 ENCOUNTER — Telehealth: Payer: Self-pay | Admitting: Family Medicine

## 2013-04-16 DIAGNOSIS — E039 Hypothyroidism, unspecified: Secondary | ICD-10-CM

## 2013-04-16 MED ORDER — LEVOTHYROXINE SODIUM 25 MCG PO TABS: 25.0000 ug | ORAL_TABLET | Freq: Every day | ORAL | Status: DC

## 2013-04-16 NOTE — Telephone Encounter (Signed)
Called patient. Left VM with son. Refilled levothyroid 30 tabs only as patient is due for repeat TSH.   Called in refill to patient's pharmacy.

## 2013-05-15 ENCOUNTER — Other Ambulatory Visit: Payer: Self-pay | Admitting: *Deleted

## 2013-05-15 DIAGNOSIS — E039 Hypothyroidism, unspecified: Secondary | ICD-10-CM

## 2013-05-15 MED ORDER — LEVOTHYROXINE SODIUM 25 MCG PO TABS: 25.0000 ug | ORAL_TABLET | Freq: Every day | ORAL | Status: DC

## 2013-05-20 ENCOUNTER — Other Ambulatory Visit: Payer: Self-pay | Admitting: Family Medicine

## 2013-05-20 MED ORDER — ESOMEPRAZOLE MAGNESIUM 40 MG PO CPDR: 40.0000 mg | DELAYED_RELEASE_CAPSULE | Freq: Every day | ORAL | Status: DC

## 2013-07-29 ENCOUNTER — Telehealth: Payer: Self-pay | Admitting: Family Medicine

## 2013-07-29 DIAGNOSIS — E039 Hypothyroidism, unspecified: Secondary | ICD-10-CM

## 2013-07-29 MED ORDER — LEVOTHYROXINE SODIUM 25 MCG PO TABS: 25.0000 ug | ORAL_TABLET | Freq: Every day | ORAL | Status: DC

## 2013-07-29 NOTE — Telephone Encounter (Signed)
Please call patient.  Dr. Armen Pickup refilled synthroid for 30 days and faxed rx to [facility] pharmacy  F/u TSH needed for additional refills.  Patient to schedule blood draw at lab. Lab ordered.

## 2013-07-29 NOTE — Telephone Encounter (Signed)
Pt informed and appt made for 08/20/13. Fleeger, Maryjo Rochester

## 2013-08-20 ENCOUNTER — Ambulatory Visit: Payer: Self-pay

## 2013-08-20 ENCOUNTER — Other Ambulatory Visit: Payer: Self-pay

## 2013-08-22 ENCOUNTER — Telehealth: Payer: Self-pay | Admitting: Family Medicine

## 2013-08-22 DIAGNOSIS — E039 Hypothyroidism, unspecified: Secondary | ICD-10-CM

## 2013-08-22 MED ORDER — LEVOTHYROXINE SODIUM 25 MCG PO TABS: 25.0000 ug | ORAL_TABLET | Freq: Every day | ORAL | Status: DC

## 2013-08-22 NOTE — Telephone Encounter (Signed)
Faxed back request from HD for synthroid. Patient past due for f/u TSH Ordered. Indicated need for patient to f/u for repeat TSH on rx. Will also have my nurses call patient.

## 2013-08-23 NOTE — Telephone Encounter (Signed)
LM for pt to call back.  Please let her know that rx was faxed to health department and that she needs a lab appt for follow up thyroid levels.  Can you also assist her in making this?  Thanks [employer]

## 2013-10-21 ENCOUNTER — Encounter: Payer: Self-pay | Admitting: Family Medicine

## 2013-10-21 ENCOUNTER — Ambulatory Visit (INDEPENDENT_AMBULATORY_CARE_PROVIDER_SITE_OTHER): Payer: Medicare Other | Admitting: Family Medicine

## 2013-10-21 ENCOUNTER — Other Ambulatory Visit: Payer: Self-pay | Admitting: *Deleted

## 2013-10-21 VITALS — BP 152/94 | HR 87 | Temp 98.1°F | Ht 64.0 in | Wt 237.0 lb

## 2013-10-21 DIAGNOSIS — E039 Hypothyroidism, unspecified: Secondary | ICD-10-CM | POA: Diagnosis not present

## 2013-10-21 DIAGNOSIS — L301 Dyshidrosis [pompholyx]: Secondary | ICD-10-CM

## 2013-10-21 DIAGNOSIS — K219 Gastro-esophageal reflux disease without esophagitis: Secondary | ICD-10-CM | POA: Diagnosis not present

## 2013-10-21 LAB — TSH: TSH: 23.738 u[IU]/mL — ABNORMAL HIGH (ref 0.350–4.500)

## 2013-10-21 MED ORDER — ESOMEPRAZOLE MAGNESIUM 40 MG PO CPDR: 40.0000 mg | DELAYED_RELEASE_CAPSULE | Freq: Every day | ORAL | Status: DC

## 2013-10-21 MED ORDER — METHYLPREDNISOLONE ACETATE 40 MG/ML IJ SUSP
40.0000 mg | Freq: Once | INTRAMUSCULAR | Status: AC
  Administered 2013-10-21: 40 mg via INTRAMUSCULAR

## 2013-10-21 MED ORDER — FLUOCINONIDE-E 0.05 % EX CREA: 1.0000 "application " | TOPICAL_CREAM | Freq: Two times a day (BID) | CUTANEOUS | Status: DC

## 2013-10-21 NOTE — Assessment & Plan Note (Signed)
A: complaint with medications P: repeat TSH

## 2013-10-21 NOTE — Addendum Note (Signed)
Addended by: Altamese DillingICHARDSON, JEANNETTE A on: 10/21/2013 05:13 PM   Modules accepted: Orders

## 2013-10-21 NOTE — Assessment & Plan Note (Signed)
A: exam consistent with DE. P: General measures per AVS Depo medrol 40 IM in office Lidex x 2-4 weeks cautioned patient regarding risk for skin discoloration.

## 2013-10-21 NOTE — Telephone Encounter (Signed)
writtten rx for which medication? I faxed rx for lidex and nexium after patient's OV.

## 2013-10-21 NOTE — Telephone Encounter (Signed)
Synthroid 0.025 mg #90--1 tab po ac breakfast.  I may have forgotten to select the med in Meds & Orders.  Gaylene Brooksichardson, Frances Ambrosino Ann, RN

## 2013-10-21 NOTE — Patient Instructions (Addendum)
Ms. [name],  Thank you for coming in today. You have dyshidrotic eczema.   For this please do the following:  General measures - In clinical experience, avoidance of irritants or exacerbating factors is beneficial for most patients with dyshidrotic eczema. General skin care measures aimed at reducing skin irritation and restoring the skin barrier include [23]: ?Using lukewarm water and soap-free cleansers to wash hands ?Drying hands thoroughly after washing ?Applying emollients (eg, petroleum jelly) immediately after hand drying and as often as possible  ?Wearing cotton gloves under vinyl or other nonlatex gloves when performing wet work ?Removing rings and watches and bracelets before wet work ?Wearing protective gloves in cold weather  ?Wearing task-specific gloves for frictional exposures (eg, gardening, carpentry) ?Avoiding exposure to irritants (eg, detergents, solvents, hair lotions or dyes, acidic foods [eg, citrus fruit])   2. Use strong steroid ointment twice daily for 2 weeks to start. Call me in two weeks if no better and we will extend course. Stop use of ointment if you notice skin lightening.   Dr. Armen PickupFunches

## 2013-10-21 NOTE — Telephone Encounter (Signed)
Will need to fax written Rx to [facility] at [phone] (90-day supply).  ~Jeannette Richardson, BSN, RN-BC  

## 2013-10-21 NOTE — Progress Notes (Signed)
   Subjective:    Patient ID: [name], female    DOB: April 16, 1951, 63 y.o.   MRN: [id number]  HPI 63 yo F presents for f/u visit:  1. Skin rash: hands and L foot x 2 months. Associated with small blisters some filled with clear fluid. Intensely pruritic. Not spreading. No lesions on wrist or arms. Worse when hands get wet. No improvement with OTC hydrocortisone.   2. Hypothyroidism: compliant with synthroid. No muscle aches or pains except L great toe. No diarrhea or constipation still with reflux.   Soc Hx: smoking    Review of Systems As per HPI     Objective:   Physical Exam BP 152/94  Pulse 87  Temp(Src) 98.1 F (36.7 C) (Oral)  Ht 5\' 4"  (1.626 m)  Wt 237 lb (107.502 kg)  BMI 40.66 kg/m2 General appearance: alert, cooperative and no distress Skin: xerotic hands with small blister on palms and in intertriginous spaces. Obviously pruritic as patient scratching throughout exam. no wrist lesions.  L foot with few small blister on lateral side.        Assessment & Plan:

## 2013-10-22 ENCOUNTER — Encounter: Payer: Self-pay | Admitting: Family Medicine

## 2013-10-22 ENCOUNTER — Telehealth: Payer: Self-pay | Admitting: Family Medicine

## 2013-10-22 DIAGNOSIS — E039 Hypothyroidism, unspecified: Secondary | ICD-10-CM

## 2013-10-22 MED ORDER — LEVOTHYROXINE SODIUM 50 MCG PO TABS: 50.0000 ug | ORAL_TABLET | Freq: Every day | ORAL | Status: DC

## 2013-10-22 NOTE — Telephone Encounter (Signed)
Attempted to call patient about her TSH. Unable to leave VM.  I last filled synthroid on 08/22/13.   Called her pharmacy.  Patient last picked up synthroid refill on 09/24/13. So patient has been takign 25 mcg daily for last 4 weeks.  Plan: refilled synthroid increase to 50 mcg, 90 tablets, f/u TSH in 8 weeks. Letter sent.

## 2013-10-22 NOTE — Assessment & Plan Note (Signed)
A: declined. Intermittently compliant with synthroid. Patient last picked up synthroid refill on 09/24/13. So patient has been takign 25 mcg daily for last 4 weeks.  Plan: refilled synthroid increase to 50 mcg, 90 tablets, f/u TSH in 8 weeks. Letter sent.

## 2013-10-30 ENCOUNTER — Other Ambulatory Visit: Payer: Self-pay | Admitting: Family Medicine

## 2013-10-30 MED ORDER — TRIAMCINOLONE ACETONIDE 0.1 % EX CREA: 1.0000 "application " | TOPICAL_CREAM | Freq: Three times a day (TID) | CUTANEOUS | Status: DC

## 2014-01-13 ENCOUNTER — Other Ambulatory Visit: Payer: Self-pay | Admitting: *Deleted

## 2014-01-13 ENCOUNTER — Other Ambulatory Visit: Payer: Self-pay | Admitting: Family Medicine

## 2014-01-13 DIAGNOSIS — E039 Hypothyroidism, unspecified: Secondary | ICD-10-CM

## 2014-01-13 NOTE — Telephone Encounter (Signed)
Unable to pt voice message; voice message is not set up. Pt need to come in for TSH level per PCP note from 10/22/2013.  Clovis Puamika L Tyrelle Raczka, RN

## 2014-01-14 MED ORDER — VENLAFAXINE HCL ER 75 MG PO CP24: 75.0000 mg | ORAL_CAPSULE | Freq: Every day | ORAL | Status: DC

## 2014-01-14 MED ORDER — LEVOTHYROXINE SODIUM 50 MCG PO TABS: 50.0000 ug | ORAL_TABLET | Freq: Every day | ORAL | Status: DC

## 2014-02-19 ENCOUNTER — Other Ambulatory Visit: Payer: Self-pay | Admitting: *Deleted

## 2014-02-19 MED ORDER — ROSUVASTATIN CALCIUM 10 MG PO TABS: 10.0000 mg | ORAL_TABLET | Freq: Every day | ORAL | Status: DC

## 2014-03-18 ENCOUNTER — Other Ambulatory Visit: Payer: Self-pay | Admitting: *Deleted

## 2014-03-18 DIAGNOSIS — E039 Hypothyroidism, unspecified: Secondary | ICD-10-CM

## 2014-04-17 ENCOUNTER — Other Ambulatory Visit: Payer: Self-pay | Admitting: *Deleted

## 2014-04-17 DIAGNOSIS — E039 Hypothyroidism, unspecified: Secondary | ICD-10-CM

## 2014-04-17 MED ORDER — LEVOTHYROXINE SODIUM 50 MCG PO TABS: 50.0000 ug | ORAL_TABLET | Freq: Every day | ORAL | Status: DC

## 2014-04-20 ENCOUNTER — Other Ambulatory Visit: Payer: Self-pay | Admitting: Family Medicine

## 2014-04-20 DIAGNOSIS — E039 Hypothyroidism, unspecified: Secondary | ICD-10-CM

## 2014-04-24 ENCOUNTER — Telehealth: Payer: Self-pay | Admitting: Family Medicine

## 2014-04-24 ENCOUNTER — Other Ambulatory Visit: Payer: Self-pay | Admitting: Family Medicine

## 2014-04-24 DIAGNOSIS — E039 Hypothyroidism, unspecified: Secondary | ICD-10-CM

## 2014-04-24 MED ORDER — LEVOTHYROXINE SODIUM 50 MCG PO TABS: 50.0000 ug | ORAL_TABLET | Freq: Every day | ORAL | Status: DC

## 2014-04-24 NOTE — Telephone Encounter (Signed)
Spoke with patient and informed her of below 

## 2014-04-24 NOTE — Telephone Encounter (Signed)
Synthroid placed at front desk as unable to e-prescribed. Have pt schedule f/u visit when she picks up medication.   Myra RudeJeremy E Ozzie Remmers, MD PGY-2,  Family Medicine 04/24/2014, 10:14 AM

## 2014-05-18 ENCOUNTER — Other Ambulatory Visit: Payer: Self-pay | Admitting: Family Medicine

## 2014-05-22 ENCOUNTER — Other Ambulatory Visit: Payer: Self-pay | Admitting: *Deleted

## 2014-05-22 DIAGNOSIS — F329 Major depressive disorder, single episode, unspecified: Secondary | ICD-10-CM

## 2014-05-22 DIAGNOSIS — F32A Depression, unspecified: Secondary | ICD-10-CM

## 2014-05-27 MED ORDER — VENLAFAXINE HCL ER 75 MG PO CP24: 75.0000 mg | ORAL_CAPSULE | Freq: Every day | ORAL | Status: DC

## 2014-05-27 NOTE — Telephone Encounter (Signed)
Patient requesting effexor, upon review of chart her, she hasn't been evaluated for this in some time. Will refill now but will need follow up soon.  Prescription is placed at the front desk as it is unable to be e-prescribed.    Myra Rude, MD PGY-2, [facility] 05/27/2014, 1:56 PM

## 2014-05-27 NOTE — Telephone Encounter (Signed)
LVM for patient to call back. ?

## 2014-06-18 ENCOUNTER — Other Ambulatory Visit: Payer: Self-pay | Admitting: *Deleted

## 2014-06-18 DIAGNOSIS — F329 Major depressive disorder, single episode, unspecified: Secondary | ICD-10-CM

## 2014-06-18 DIAGNOSIS — F32A Depression, unspecified: Secondary | ICD-10-CM

## 2014-06-18 NOTE — Telephone Encounter (Signed)
Unsure of the prescriber of her effexor. Will need follow up in order to have this refilled. In review of her chart, this was started on 05/27/14 and #90 for daily use. Should not need refill and cannot find origin of a diagnosis.   Myra RudeJeremy E Schmitz, MD PGY-2, [place] 06/18/2014, 11:06 PM

## 2014-07-08 ENCOUNTER — Other Ambulatory Visit: Payer: Self-pay | Admitting: *Deleted

## 2014-07-08 DIAGNOSIS — E039 Hypothyroidism, unspecified: Secondary | ICD-10-CM

## 2014-07-10 MED ORDER — LEVOTHYROXINE SODIUM 50 MCG PO TABS: 50.0000 ug | ORAL_TABLET | Freq: Every day | ORAL | Status: DC

## 2014-07-21 ENCOUNTER — Telehealth: Payer: Self-pay | Admitting: Family Medicine

## 2014-07-22 NOTE — Telephone Encounter (Signed)
Spoke with patient and informed her that Dr. Jordan LikesSchmitz has sat the rx up front for pick up

## 2014-07-22 NOTE — Telephone Encounter (Signed)
LVM for patient tot call back

## 2014-09-19 ENCOUNTER — Ambulatory Visit (INDEPENDENT_AMBULATORY_CARE_PROVIDER_SITE_OTHER): Payer: Medicare Other | Admitting: Family Medicine

## 2014-09-19 ENCOUNTER — Encounter: Payer: Self-pay | Admitting: Family Medicine

## 2014-09-19 VITALS — BP 135/73 | HR 53 | Temp 98.6°F | Ht 64.0 in | Wt 236.2 lb

## 2014-09-19 DIAGNOSIS — E030 Congenital hypothyroidism with diffuse goiter: Secondary | ICD-10-CM | POA: Diagnosis not present

## 2014-09-19 DIAGNOSIS — L301 Dyshidrosis [pompholyx]: Secondary | ICD-10-CM

## 2014-09-19 DIAGNOSIS — F329 Major depressive disorder, single episode, unspecified: Secondary | ICD-10-CM | POA: Diagnosis not present

## 2014-09-19 DIAGNOSIS — F331 Major depressive disorder, recurrent, moderate: Secondary | ICD-10-CM

## 2014-09-19 DIAGNOSIS — F32A Depression, unspecified: Secondary | ICD-10-CM

## 2014-09-19 DIAGNOSIS — I10 Essential (primary) hypertension: Secondary | ICD-10-CM

## 2014-09-19 DIAGNOSIS — L309 Dermatitis, unspecified: Secondary | ICD-10-CM | POA: Diagnosis not present

## 2014-09-19 DIAGNOSIS — E039 Hypothyroidism, unspecified: Secondary | ICD-10-CM | POA: Diagnosis not present

## 2014-09-19 DIAGNOSIS — B372 Candidiasis of skin and nail: Secondary | ICD-10-CM

## 2014-09-19 LAB — COMPREHENSIVE METABOLIC PANEL
ALT: <8 U/L (ref 0–35)
AST: 10 U/L (ref 0–37)
Albumin: 3.5 g/dL (ref 3.5–5.2)
Alkaline Phosphatase: 69 U/L (ref 39–117)
BUN: 12 mg/dL (ref 6–23)
CHLORIDE: 108 meq/L (ref 96–112)
CO2: 29 meq/L (ref 19–32)
CREATININE: 1.54 mg/dL — AB (ref 0.50–1.10)
Calcium: 9.5 mg/dL (ref 8.4–10.5)
GLUCOSE: 81 mg/dL (ref 70–99)
POTASSIUM: 3.6 meq/L (ref 3.5–5.3)
Sodium: 142 mEq/L (ref 135–145)
Total Bilirubin: 0.4 mg/dL (ref 0.2–1.2)
Total Protein: 6.5 g/dL (ref 6.0–8.3)

## 2014-09-19 MED ORDER — VENLAFAXINE HCL ER 75 MG PO CP24: 75.0000 mg | ORAL_CAPSULE | Freq: Every day | ORAL | Status: DC

## 2014-09-19 MED ORDER — HYDROXYZINE HCL 10 MG PO TABS: 10.0000 mg | ORAL_TABLET | Freq: Three times a day (TID) | ORAL | Status: DC | PRN

## 2014-09-19 MED ORDER — TRIAMCINOLONE ACETONIDE 0.1 % EX CREA: 1.0000 "application " | TOPICAL_CREAM | Freq: Three times a day (TID) | CUTANEOUS | Status: DC

## 2014-09-19 MED ORDER — KETOCONAZOLE 2 % EX CREA: 1.0000 "application " | TOPICAL_CREAM | Freq: Every day | CUTANEOUS | Status: DC

## 2014-09-19 MED ORDER — LEVOTHYROXINE SODIUM 50 MCG PO TABS: 50.0000 ug | ORAL_TABLET | Freq: Every day | ORAL | Status: DC

## 2014-09-19 NOTE — Assessment & Plan Note (Signed)
Hx of noncompliance with synthroid.  - refilled today  - TSH and Free T4 pending

## 2014-09-19 NOTE — Progress Notes (Signed)
   Subjective:    Patient ID: [name], female    DOB: 14-Oct-1950, 64 y.o.   MRN: [id number]  HPI  [name] is here for HTN f/u, eczema and depression.  HTN Disease Monitoring: Home BP Monitoring unknown Chest pain- no    Dyspnea- no  Medications: hasn't taken in over a week. maxide  Compliance-  no. Lightheadedness-  no  Edema- no  Itching: Started the last two months. Started on palmer aspect of each hand. Hx of dyshidrotic eczema. Triamcinolone has helped but itching still present.   Depression: has been taking it since [facility]. It is stable but she feels down when she is not on it. Last taken over a month ago. Lack of sleep, energy and concentration. Eats about once a day. No thoughts of SI or HI.      Current Outpatient Prescriptions on File Prior to Visit  Medication Sig Dispense Refill  . aspirin (ASPIRIN EC) 81 MG EC tablet Take 1 tablet (81 mg total) by mouth daily. Swallow whole. 30 tablet 12  . esomeprazole (NEXIUM) 40 MG capsule Take 1 capsule (40 mg total) by mouth daily. 90 capsule 3  . levothyroxine (SYNTHROID, LEVOTHROID) 50 MCG tablet Take 1 tablet (50 mcg total) by mouth daily before breakfast. 30 tablet 3  . rosuvastatin (CRESTOR) 10 MG tablet Take 1 tablet (10 mg total) by mouth daily. 90 tablet 3  . triamcinolone cream (KENALOG) 0.1 % Apply 1 application topically 3 (three) times daily. Apply to affected areas of hands. 85.2 g 0  . triamterene-hydrochlorothiazide (MAXZIDE-25) 37.5-25 MG per tablet TAKE ONE TABLET BY MOUTH ONCE DAILY 90 tablet 0  . venlafaxine XR (EFFEXOR-XR) 75 MG 24 hr capsule Take 1 capsule (75 mg total) by mouth daily. 90 capsule 0   No current facility-administered medications on file prior to visit.   SHx: Smoke 1/2 PPD for 32 years. Social Drinker, no illicit drugs.   Health Maintenance: Needs pap smear, mammogram and colonoscopy  Review of Systems See HPI       Objective:   Physical Exam BP 135/73 mmHg  Pulse 53  Temp(Src) 98.6 F (37 C) (Oral)  Ht 5\' 4"  (1.626 m)  Wt 236 lb 3.2 oz (107.14 kg)  BMI 40.52 kg/m2 Gen: NAD, alert, cooperative with exam,  CV: bradycardic, regular rhythm, good S1/S2, no murmur, no edema, capillary refill brisk  Resp: CTABL, no wheezes, non-labored Skin: xerotic hands with small blisters. Intertrigo under her breasts b/l,       Assessment & Plan:

## 2014-09-19 NOTE — Assessment & Plan Note (Signed)
Hx of taking effexor. Hasn't taken in over a month.  - refilled Effexor.  - f/u in two weeks.

## 2014-09-19 NOTE — Assessment & Plan Note (Signed)
Previous diagnosis that appears to be exacerbated by the cold weather.  - triamcinolone refilled - atarax for itching

## 2014-09-19 NOTE — Assessment & Plan Note (Signed)
Controlled today despite not taking her medications for a week.  - HCTZ refilled.  - stopped maxide  - labs pending

## 2014-09-19 NOTE — Patient Instructions (Signed)
Thank you for coming in,   Please use the ketoconazole cream under the breast. Use this daily for two weeks.   Use the atarax for itching and the triamcinolone cream on the spots that are itchy.   We will call you back with the lab results from today.   Please follow up with me in 4 weeks to make sure that your spots are getting better.    Please feel free to call with any questions or concerns at any time, at [phone]. --Dr. Elliot GaultSchmitz  Intertrigo Intertrigo is a skin condition that occurs in between folds of skin in places on the body that rub together a lot and do not get much ventilation. It is caused by heat, moisture, friction, sweat retention, and lack of air circulation, which produces red, irritated patches and, sometimes, scaling or drainage. People who have diabetes, who are obese, or who have treatment with antibiotics are at increased risk for intertrigo. The most common sites for intertrigo to occur include:  The groin.  The breasts.  The armpits.  Folds of abdominal skin.  Webbed spaces between the fingers or toes. Intertrigo may be aggravated by:  Sweat.  Feces.  Yeast or bacteria that are present near skin folds.  Urine.  Vaginal discharge. HOME CARE INSTRUCTIONS  The following steps can be taken to reduce friction and keep the affected area cool and dry:  Expose skin folds to the air.  Keep deep skin folds separated with cotton or linen cloth. Avoid tight fitting clothing that could cause chafing.  Wear open-toed shoes or sandals to help reduce moisture between the toes.  Apply absorbent powders to affected areas as directed by your caregiver.  Apply over-the-counter barrier pastes, such as zinc oxide, as directed by your caregiver.  If you develop a fungal infection in the affected area, your caregiver may have you use antifungal creams. SEEK MEDICAL CARE IF:   The rash is not improving after 1 week of treatment.  The rash is getting worse (more  red, more swollen, more painful, or spreading).  You have a fever or chills. MAKE SURE YOU:   Understand these instructions.  Will watch your condition.  Will get help right away if you are not doing well or get worse. Document Released: 08/29/2005 Document Revised: 11/21/2011 Document Reviewed: 02/11/2010 [facility] Patient Information 2015 [employer] [place]. This information is not intended to replace advice given to you by your health care provider. Make sure you discuss any questions you have with your health care provider.

## 2014-09-19 NOTE — Assessment & Plan Note (Signed)
Topical ketoconazole cream

## 2014-09-20 LAB — TSH: TSH: 23.086 u[IU]/mL — ABNORMAL HIGH (ref 0.350–4.500)

## 2014-09-20 LAB — T4, FREE: FREE T4: 0.53 ng/dL — AB (ref 0.80–1.80)

## 2014-09-22 ENCOUNTER — Telehealth: Payer: Self-pay | Admitting: *Deleted

## 2014-09-22 DIAGNOSIS — L309 Dermatitis, unspecified: Secondary | ICD-10-CM

## 2014-09-22 MED ORDER — HYDROXYZINE HCL 25 MG PO TABS: 12.5000 mg | ORAL_TABLET | Freq: Three times a day (TID) | ORAL | Status: DC | PRN

## 2014-09-22 NOTE — Telephone Encounter (Signed)
LVM with a female for patient to call us back to inform her of below

## 2014-09-22 NOTE — Telephone Encounter (Signed)
Faxed new RX.   Murtis SinkSam Isabell Bonafede, MD [facility] Resident, PGY-3 09/22/2014, 12:35 PM

## 2014-09-22 NOTE — Telephone Encounter (Signed)
Olegario MessierKathy from [facility] called stating that they don't have Hydroxyzine 10 mg in stock only the 25 mg and 50 mg. If provider prefer to change to 25 or 50 mg and change directions or have pt take Rx to different pharmacy.  Please give Olegario MessierKathy a call at [phone].  Clovis PuMartin, Leani Myron L, RN

## 2014-10-02 ENCOUNTER — Telehealth: Payer: Self-pay | Admitting: *Deleted

## 2014-10-02 NOTE — Telephone Encounter (Signed)
-----   Message from Myra RudeJeremy E Schmitz, MD sent at 10/01/2014  3:26 PM EST ----- Please inform that her TSH is high and free T4 is low indicating hypothyroidism that isn't controlled. She will need to be seen in 4 weeks once she has taken her thyroid medication. Please make sure that she is taking it. Thanks.

## 2014-10-02 NOTE — Telephone Encounter (Signed)
Spoke with pt and informed her of the below. Pt will call and schedule appt. Lamonte SakaiZimmerman Rumple, April D

## 2014-12-24 ENCOUNTER — Other Ambulatory Visit: Payer: Self-pay | Admitting: *Deleted

## 2014-12-24 DIAGNOSIS — F329 Major depressive disorder, single episode, unspecified: Secondary | ICD-10-CM

## 2014-12-24 DIAGNOSIS — F32A Depression, unspecified: Secondary | ICD-10-CM

## 2014-12-24 MED ORDER — VENLAFAXINE HCL ER 75 MG PO CP24: 75.0000 mg | ORAL_CAPSULE | Freq: Every day | ORAL | Status: DC

## 2015-01-15 MED ORDER — VENLAFAXINE HCL ER 75 MG PO CP24
75.0000 mg | ORAL_CAPSULE | Freq: Every day | ORAL | Status: DC
Start: 2015-01-15 — End: 2015-06-23

## 2015-01-15 NOTE — Addendum Note (Signed)
Addended by: Clovis PuMARTIN, Zaron Zwiefelhofer L on: 01/15/2015 09:45 AM   Modules accepted: Orders

## 2015-01-15 NOTE — Telephone Encounter (Signed)
[facility] did not received refill request from December 24, 2014.  Refill stated print. Will fax Rx to [facility].  Clovis PuMartin, Atianna Haidar L, RN

## 2015-02-13 ENCOUNTER — Other Ambulatory Visit: Payer: Self-pay | Admitting: *Deleted

## 2015-02-13 DIAGNOSIS — E039 Hypothyroidism, unspecified: Secondary | ICD-10-CM

## 2015-02-16 ENCOUNTER — Other Ambulatory Visit: Payer: Self-pay | Admitting: Family Medicine

## 2015-02-16 MED ORDER — LEVOTHYROXINE SODIUM 50 MCG PO TABS: 50.0000 ug | ORAL_TABLET | Freq: Every day | ORAL | Status: DC

## 2015-02-16 NOTE — Telephone Encounter (Signed)
Called patient and informed her of the prescription at pharmacy and that she needed to make an office appointment.Erin HawkSimpson, [name]

## 2015-02-23 ENCOUNTER — Ambulatory Visit: Payer: Self-pay

## 2015-04-01 ENCOUNTER — Encounter: Payer: Self-pay | Admitting: Family Medicine

## 2015-04-01 ENCOUNTER — Ambulatory Visit (INDEPENDENT_AMBULATORY_CARE_PROVIDER_SITE_OTHER): Payer: Self-pay | Admitting: Family Medicine

## 2015-04-01 ENCOUNTER — Telehealth: Payer: Self-pay | Admitting: Family Medicine

## 2015-04-01 VITALS — BP 181/119 | HR 93 | Temp 98.3°F | Wt 230.9 lb

## 2015-04-01 DIAGNOSIS — E039 Hypothyroidism, unspecified: Secondary | ICD-10-CM

## 2015-04-01 DIAGNOSIS — L309 Dermatitis, unspecified: Secondary | ICD-10-CM

## 2015-04-01 DIAGNOSIS — E785 Hyperlipidemia, unspecified: Secondary | ICD-10-CM

## 2015-04-01 DIAGNOSIS — A599 Trichomoniasis, unspecified: Secondary | ICD-10-CM

## 2015-04-01 DIAGNOSIS — M79675 Pain in left toe(s): Secondary | ICD-10-CM

## 2015-04-01 DIAGNOSIS — I10 Essential (primary) hypertension: Secondary | ICD-10-CM | POA: Diagnosis not present

## 2015-04-01 LAB — BASIC METABOLIC PANEL WITH GFR
BUN: 13 mg/dL (ref 6–23)
CHLORIDE: 106 meq/L (ref 96–112)
CO2: 27 mEq/L (ref 19–32)
Calcium: 9.2 mg/dL (ref 8.4–10.5)
Creat: 1.6 mg/dL — ABNORMAL HIGH (ref 0.50–1.10)
GFR, EST NON AFRICAN AMERICAN: 34 mL/min — AB
GFR, Est African American: 39 mL/min — ABNORMAL LOW
Glucose, Bld: 88 mg/dL (ref 70–99)
Potassium: 3.5 mEq/L (ref 3.5–5.3)
Sodium: 146 mEq/L — ABNORMAL HIGH (ref 135–145)

## 2015-04-01 LAB — POCT URINALYSIS DIPSTICK
Bilirubin, UA: NEGATIVE
Glucose, UA: NEGATIVE
KETONES UA: NEGATIVE
NITRITE UA: NEGATIVE
Spec Grav, UA: 1.02
UROBILINOGEN UA: 0.2
pH, UA: 7

## 2015-04-01 LAB — LIPID PANEL
CHOL/HDL RATIO: 2.6 ratio
CHOLESTEROL: 135 mg/dL (ref 0–200)
HDL: 51 mg/dL (ref >=46)
LDL CALC: 72 mg/dL (ref 0–99)
Triglycerides: 61 mg/dL (ref <150)
VLDL: 12 mg/dL (ref 0–40)

## 2015-04-01 LAB — POCT UA - MICROSCOPIC ONLY: TRICHOMONAS UA: POSITIVE

## 2015-04-01 LAB — TSH: TSH: 43.1 u[IU]/mL — AB (ref 0.350–4.500)

## 2015-04-01 LAB — T3, FREE: T3 FREE: 1.1 pg/mL — AB (ref 2.3–4.2)

## 2015-04-01 LAB — T4, FREE: Free T4: 0.37 ng/dL — ABNORMAL LOW (ref 0.80–1.80)

## 2015-04-01 MED ORDER — METRONIDAZOLE 500 MG PO TABS: 2000.0000 mg | ORAL_TABLET | Freq: Once | ORAL | Status: DC

## 2015-04-01 MED ORDER — ROSUVASTATIN CALCIUM 10 MG PO TABS: 10.0000 mg | ORAL_TABLET | Freq: Every day | ORAL | Status: DC

## 2015-04-01 MED ORDER — LISINOPRIL 10 MG PO TABS: 10.0000 mg | ORAL_TABLET | Freq: Every day | ORAL | Status: DC

## 2015-04-01 MED ORDER — TRIAMCINOLONE ACETONIDE 0.1 % EX CREA
1.0000 | TOPICAL_CREAM | Freq: Three times a day (TID) | CUTANEOUS | Status: DC
Start: 2015-04-01 — End: 2015-04-09

## 2015-04-01 MED ORDER — ASPIRIN 81 MG PO TBEC: 81.0000 mg | DELAYED_RELEASE_TABLET | Freq: Every day | ORAL | Status: DC

## 2015-04-01 MED ORDER — LEVOTHYROXINE SODIUM 50 MCG PO TABS: 50.0000 ug | ORAL_TABLET | Freq: Every day | ORAL | Status: DC

## 2015-04-01 NOTE — Progress Notes (Addendum)
Subjective:     HPI  [name] is here for f/u.   Left great toe pain: She had her left great toenail removed in 2014. One week ago she started having this puffiness that is just proximal to the proximal nail fold. It is about a 1 out of 10 and a dull ache. She denies any injury or trauma. This does not happen to her previously. It is sensitive to touch. She has a history of this for dyshidrotic Eczema and it seems to be the early stages of that.  HTN Disease Monitoring: Home BP Monitoring: haven't checked in month  Chest pain- no     Dyspnea- no Medications: haven't taken in one month  Compliance-  no.  Lightheadedness-  no   Edema- no  HLD: lipid profile hasn't been checked since 2014. Has been given crestor with intermittent compliance. No intolerance to her statin.    Hypothyroidism: she reports sleeping with her head up as her neck feels full. She has been taking her synthroid.    Health Maintenance:  Health Maintenance Due  Topic Date Due  . HIV Screening  10/04/1966  . COLONOSCOPY  10/04/2001  . PAP SMEAR  01/11/2008  . MAMMOGRAM  10/03/2010  . ZOSTAVAX  10/05/2011    -  reports that she has been smoking Cigarettes.  She has been smoking about 0.50 packs per day. She does not have any smokeless tobacco history on file. - Review of Systems: Per HPI. All other systems reviewed and are negative. - Past Medical History: Patient Active Problem List   Diagnosis Date Noted  . Candidal intertrigo 09/19/2014  . Dyshidrotic eczema 10/21/2013  . Goiter, unspecified 09/28/2009  . Hypothyroidism 02/06/2009  . CHRONIC KIDNEY DISEASE STAGE III (MODERATE) 07/27/2007  . Major depressive disorder, recurrent episode 11/09/2006  . TOBACCO DEPENDENCE 11/09/2006  . HYPERTENSION, BENIGN SYSTEMIC 11/09/2006   - Medications: reviewed and updated Current Outpatient Prescriptions  Medication Sig Dispense Refill  . aspirin (ASPIRIN EC) 81 MG EC tablet Take 1 tablet (81 mg  total) by mouth daily. Swallow whole. 30 tablet 12  . esomeprazole (NEXIUM) 40 MG capsule Take 1 capsule (40 mg total) by mouth daily. 90 capsule 3  . hydrOXYzine (ATARAX/VISTARIL) 25 MG tablet Take 0.5-1 tablets (12.5-25 mg total) by mouth 3 (three) times daily as needed for itching. 50 tablet 1  . ketoconazole (NIZORAL) 2 % cream Apply 1 application topically daily. 15 g 0  . levothyroxine (SYNTHROID, LEVOTHROID) 50 MCG tablet Take 1 tablet (50 mcg total) by mouth daily before breakfast. 30 tablet 3  . lisinopril (PRINIVIL,ZESTRIL) 10 MG tablet Take 1 tablet (10 mg total) by mouth daily. 30 tablet 3  . rosuvastatin (CRESTOR) 10 MG tablet Take 1 tablet (10 mg total) by mouth daily. 90 tablet 3  . triamcinolone cream (KENALOG) 0.1 % Apply 1 application topically 3 (three) times daily. Apply to affected areas of hands. 85.2 g 0  . venlafaxine XR (EFFEXOR-XR) 75 MG 24 hr capsule Take 1 capsule (75 mg total) by mouth daily. 90 capsule 0   No current facility-administered medications for this visit.     Review of Systems See HPI     Objective:   Physical Exam BP 181/119 mmHg  Pulse 93  Temp(Src) 98.3 F (36.8 C) (Oral)  Wt 230 lb 14.4 oz (104.736 kg) Gen: NAD, alert, cooperative with exam HEENT: NCAT, no goiter palpated, clear conjunctiva, supple neck CV: RRR, good S1/S2, no murmur, no edema, capillary  refill brisk  Resp: CTABL, no wheezes, non-labored Skin: vesicular lesion on left great toe at proximal nail fold, vesicular lesion on medial forefoot of left foot. several hyperpigmented and vesicular lesions on trunk, back, arm, legs and feet. Neuro: no gross deficits.  Psych: good insight, alert and oriented   Assessment & Plan:

## 2015-04-01 NOTE — Assessment & Plan Note (Signed)
Intermittent statin use. Reports no intolerance - Lipid panel today

## 2015-04-01 NOTE — Telephone Encounter (Signed)
Spoke with patient about her trichomonas in her urine. She reports no sexual activity. Will send in flagyl. She has also picked up her bp medication.   Myra RudeJeremy E Brissa Asante, MD PGY-3, [facility] 04/01/2015, 7:44 PM

## 2015-04-01 NOTE — Assessment & Plan Note (Signed)
Vesicular lesion near the proximal nail fold of the great toe on the left foot. Looks similar to her other forms of eczema located on other parts of her body. No injury or trauma. No suggestion of gout. Will continue to monitor with close follow-up

## 2015-04-01 NOTE — Patient Instructions (Signed)
Thank you for coming in,   Please make an appointment with me next week. For skin bx and we will check your eyes.   We will see you the week after that for pap smear.   I have refilled your medications.   Please bring all of your medications with you to each visit.    Please feel free to call with any questions or concerns at any time, at [phone]. --Dr. Jordan LikesSchmitz

## 2015-04-01 NOTE — Assessment & Plan Note (Signed)
Intermittent Synthroid use but asymptomatic today - TSH, free T4 and free T3

## 2015-04-01 NOTE — Assessment & Plan Note (Signed)
Uncontrolled and asymptomatic today. She is not been able to follow-up due to lack of insurance Blood pressure still elevated upon recheck - BMP and urinalysis today - The Cipro was sent to health department but she reports it may take a week to fill. [facility] she could [facility] up [facility] - She will follow-up in one week

## 2015-04-03 ENCOUNTER — Telehealth: Payer: Self-pay | Admitting: Family Medicine

## 2015-04-03 NOTE — Telephone Encounter (Signed)
Left VM for patient. If she calls back please have her speak with a nurse/CMA and ask if she is taking her synthroid. Her TSH is up. If she hasn't been taking then we will need to re-check her TSH in 6 weeks but if she has then we will need to adjust her medication. Wanted to make sure that she picked up the flagyl as well.   If any questions then please take the best time and phone number to call and I will try to call her back.   Myra Rude, MD PGY-3, [facility] 04/03/2015, 4:25 PM

## 2015-04-09 ENCOUNTER — Ambulatory Visit (INDEPENDENT_AMBULATORY_CARE_PROVIDER_SITE_OTHER): Payer: Medicare Other | Admitting: Family Medicine

## 2015-04-09 ENCOUNTER — Encounter: Payer: Self-pay | Admitting: Family Medicine

## 2015-04-09 VITALS — BP 179/111 | HR 98 | Temp 98.6°F | Ht 64.0 in | Wt 231.5 lb

## 2015-04-09 DIAGNOSIS — R21 Rash and other nonspecific skin eruption: Secondary | ICD-10-CM | POA: Diagnosis not present

## 2015-04-09 DIAGNOSIS — L309 Dermatitis, unspecified: Secondary | ICD-10-CM | POA: Diagnosis not present

## 2015-04-09 DIAGNOSIS — I10 Essential (primary) hypertension: Secondary | ICD-10-CM | POA: Diagnosis not present

## 2015-04-09 DIAGNOSIS — E039 Hypothyroidism, unspecified: Secondary | ICD-10-CM

## 2015-04-09 MED ORDER — TRIAMCINOLONE ACETONIDE 0.1 % EX CREA: 1.0000 "application " | TOPICAL_CREAM | Freq: Three times a day (TID) | CUTANEOUS | Status: DC

## 2015-04-09 MED ORDER — AMLODIPINE BESYLATE 10 MG PO TABS: 10.0000 mg | ORAL_TABLET | Freq: Every day | ORAL | Status: DC

## 2015-04-09 NOTE — Progress Notes (Addendum)
Subjective:  HPI  [name] is here for f/u BP, Hypothyroid and punch bx.  HTN Disease Monitoring: Home BP Monitoring   Medications:lisinipril  Chest pain- no     Dyspnea- no Compliance-  no.  Lightheadedness-  no   Edema- no  Hypothyroidism She hasn't been able to pick up her rx that was [facility].  She is asymptomatic today.  Lab showing elevation of TSH.    Rash:  Started developing since her toe nail removal in 2014 Hx of dyshidrotic eczema Started in her hands and now scattered throughout her body.  They have developed blisters on her feet.   Triamcinolone cream has helped some but her rashes have progresses.  She continues to take atarax for pruritis.    Health Maintenance:  - Given information about mammogram and colonoscopy  Health Maintenance Due  Topic Date Due  . HIV Screening  10/04/1966  . COLONOSCOPY  10/04/2001  . PAP SMEAR  01/11/2008  . MAMMOGRAM  10/03/2010  . ZOSTAVAX  10/05/2011    -  reports that she has been smoking Cigarettes.  She has been smoking about 0.50 packs per day. She does not have any smokeless tobacco history on file. - Review of Systems: Per HPI. All other systems reviewed and are negative. - Past Medical History: Patient Active Problem List   Diagnosis Date Noted  . Pain of left great toe 04/01/2015  . HLD (hyperlipidemia) 04/01/2015  . Candidal intertrigo 09/19/2014  . Dyshidrotic eczema 10/21/2013  . Goiter, unspecified 09/28/2009  . Hypothyroidism 02/06/2009  . CHRONIC KIDNEY DISEASE STAGE III (MODERATE) 07/27/2007  . Major depressive disorder, recurrent episode 11/09/2006  . TOBACCO DEPENDENCE 11/09/2006  . HYPERTENSION, BENIGN SYSTEMIC 11/09/2006   - Medications: reviewed and updated Current Outpatient Prescriptions  Medication Sig Dispense Refill  . amLODipine (NORVASC) 10 MG tablet Take 1 tablet (10 mg total) by mouth daily. 30 tablet 0  . aspirin (ASPIRIN EC) 81 MG EC tablet Take 1  tablet (81 mg total) by mouth daily. Swallow whole. 30 tablet 12  . esomeprazole (NEXIUM) 40 MG capsule Take 1 capsule (40 mg total) by mouth daily. 90 capsule 3  . hydrOXYzine (ATARAX/VISTARIL) 25 MG tablet Take 0.5-1 tablets (12.5-25 mg total) by mouth 3 (three) times daily as needed for itching. 50 tablet 1  . ketoconazole (NIZORAL) 2 % cream Apply 1 application topically daily. 15 g 0  . levothyroxine (SYNTHROID, LEVOTHROID) 50 MCG tablet Take 1 tablet (50 mcg total) by mouth daily before breakfast. 30 tablet 3  . lisinopril (PRINIVIL,ZESTRIL) 10 MG tablet Take 1 tablet (10 mg total) by mouth daily. 30 tablet 3  . metroNIDAZOLE (FLAGYL) 500 MG tablet Take 4 tablets (2,000 mg total) by mouth once. 4 tablet 0  . rosuvastatin (CRESTOR) 10 MG tablet Take 1 tablet (10 mg total) by mouth daily. 90 tablet 3  . triamcinolone cream (KENALOG) 0.1 % Apply 1 application topically 3 (three) times daily. Apply to affected areas of hands. 120 g 0  . venlafaxine XR (EFFEXOR-XR) 75 MG 24 hr capsule Take 1 capsule (75 mg total) by mouth daily. 90 capsule 0   No current facility-administered medications for this visit.     Review of Systems See HPI     Objective:   Physical Exam BP 179/111 mmHg  Pulse 98  Temp(Src) 98.6 F (37 C) (Oral)  Ht  (1.626 m)  Wt 231 lb 8 oz (105.008 kg)  BMI 39.72  kg/m2 Gen: NAD, alert, cooperative with exam HEENT: no dentition  CV: RRR, good S1/S2, no murmur, no edema, capillary refill brisk  Resp: CTABL, no wheezes, non-labored Skin: several hyperpigmented areas on legs, arms and back. New vesicular lesion on right anterior tibia   Skin Punch Biopsy: Right anterior tibia  Consent obtained and time out performed. Area cleaned with alcohol and 1 ml of 1% lidocaine with epi injected in the skin. Then area cleaned with betadine.  Then a 4 mm punch biopsy used to remove a portion of the lesion with a border or normal appearing skin. The circle of skin was then  undermined with scissors and removed with forceps. A dressing was applied. No bleeding, and the patient did well.      Assessment & Plan:

## 2015-04-09 NOTE — Patient Instructions (Addendum)
Thank you for coming in,   Please call us if you see any form of infection such as redness or pus from the site.  I will either call or send you a letter with the results from today.  Please start taking the lisinopril two times per day.   I have added another medication called amlodipine.  Please start taking your thyroid medication.    Please follow-up with me in one week.  Please bring all of your medications with you to each visit.    Please feel free to call with any questions or concerns at any time, at [phone]. --Dr. Jordan Likes  Diet Recommendations for High blood pressure   Starchy (carb) foods include: Bread, rice, pasta, potatoes, corn, crackers, bagels, muffins, all baked goods.   Protein foods include: Meat, fish, poultry, eggs, dairy foods, and beans such as pinto and kidney beans (beans also provide carbohydrate).   1. Eat at least 3 meals and 1-2 snacks per day. Never go more than 4-5 hours while awake without eating.  2. Limit starchy foods to TWO per meal and ONE per snack. ONE portion of a starchy  food is equal to the following:   - ONE slice of bread (or its equivalent, such as half of a hamburger bun).   - 1/2 cup of a "scoopable" starchy food such as potatoes or rice.   - 1 OUNCE (28 grams) of starchy snack foods such as crackers or pretzels (look on label).   - 15 grams of carbohydrate as shown on food label.  3. Both lunch and dinner should include a protein food, a carb food, and vegetables.   - Obtain twice as many veg's as protein or carbohydrate foods for both lunch and dinner.   - Try to keep frozen veg's on hand for a quick vegetable serving.     - Fresh or frozen veg's are best.  4. Breakfast should always include protein.

## 2015-04-09 NOTE — Assessment & Plan Note (Signed)
Uncontrolled as she is not taking her Synthroid. - Continue with current dose - Repeat TSH in 6 weeks

## 2015-04-09 NOTE — Assessment & Plan Note (Signed)
Uncontrolled and asymptomatic. - Increase the lisinopril 20 mg daily - Added amlodipine 10 mg - Follow-up in one week. Repeat BMP at that time

## 2015-04-09 NOTE — Assessment & Plan Note (Signed)
Skin biopsy taken today. Patient tolerated procedure. History of dyshidrotic eczema but possible for Dermatitis herpetiformis - Refilled triamcinolone - Further treatment will depend upon results - Discussed with Dr. Deirdre Priest

## 2015-04-14 NOTE — Addendum Note (Signed)
Addended by: Clare Gandy E on: 04/14/2015 01:40 PM   Modules accepted: Kipp Brood

## 2015-04-16 ENCOUNTER — Ambulatory Visit (INDEPENDENT_AMBULATORY_CARE_PROVIDER_SITE_OTHER): Payer: Self-pay | Admitting: Family Medicine

## 2015-04-16 VITALS — BP 172/86 | HR 91 | Temp 98.4°F | Ht 64.0 in | Wt 234.7 lb

## 2015-04-16 DIAGNOSIS — I10 Essential (primary) hypertension: Secondary | ICD-10-CM

## 2015-04-16 DIAGNOSIS — R21 Rash and other nonspecific skin eruption: Secondary | ICD-10-CM

## 2015-04-16 NOTE — Patient Instructions (Signed)
Thank you for coming in,   Increase your lisinopril to 40 mg daily. I will send a refill of this into [facility].   I will call the other medications into the health department. Please let me know if this doesn't go through.   Please follow up with me in 4 weeks as we will need to test your thyroid function once you have been taking your thyroid medication.   Please bring all of your medications with you to each visit.    Please feel free to call with any questions or concerns at any time, at [phone]. --Dr. Jordan Likes

## 2015-04-16 NOTE — Progress Notes (Signed)
   Subjective:    [name] - 64 y.o. female MRN [id number]  Date of birth: July 12, 1951  HPI  [name] is here for HTN f/u.   HTN Disease Monitoring: Home BP Monitoring 172/86  Medications:amlodipine, lisinopril 20 mg  Chest pain- no     Dyspnea- n Compliance-  no.  Lightheadedness-  no   Edema- no   Skin bx: was negative for immunofluorescence. Spoke with the reading pathologist that it could be fascicular dermatitsi. Patient has continued to take triamcinolone with no improvement and continues to be pruritic.   Health Maintenance:  Health Maintenance Due  Topic Date Due  . HIV Screening  10/04/1966  . COLONOSCOPY  10/04/2001  . PAP SMEAR  01/11/2008  . MAMMOGRAM  10/03/2010  . ZOSTAVAX  10/05/2011  . INFLUENZA VACCINE  04/13/2015    -  reports that she has been smoking Cigarettes.  She has been smoking about 0.50 packs per day. She does not have any smokeless tobacco history on file. - Review of Systems: Per HPI. All other systems reviewed and are negative. - Past Medical History: Patient Active Problem List   Diagnosis Date Noted  . Pain of left great toe 04/01/2015  . HLD (hyperlipidemia) 04/01/2015  . Candidal intertrigo 09/19/2014  . Rash and nonspecific skin eruption 10/21/2013  . Goiter, unspecified 09/28/2009  . Hypothyroidism 02/06/2009  . CHRONIC KIDNEY DISEASE STAGE III (MODERATE) 07/27/2007  . Major depressive disorder, recurrent episode 11/09/2006  . TOBACCO DEPENDENCE 11/09/2006  . HYPERTENSION, BENIGN SYSTEMIC 11/09/2006   - Medications: reviewed and updated Current Outpatient Prescriptions  Medication Sig Dispense Refill  . amLODipine (NORVASC) 10 MG tablet Take 1 tablet (10 mg total) by mouth daily. 30 tablet 0  . aspirin (ASPIRIN EC) 81 MG EC tablet Take 1 tablet (81 mg total) by mouth daily. Swallow whole. 30 tablet 12  . esomeprazole (NEXIUM) 40 MG capsule Take 1 capsule (40 mg total) by mouth daily. 90 capsule 3  . hydrOXYzine  (ATARAX/VISTARIL) 25 MG tablet Take 0.5-1 tablets (12.5-25 mg total) by mouth 3 (three) times daily as needed for itching. 50 tablet 1  . ketoconazole (NIZORAL) 2 % cream Apply 1 application topically daily. 15 g 0  . levothyroxine (SYNTHROID, LEVOTHROID) 50 MCG tablet Take 1 tablet (50 mcg total) by mouth daily before breakfast. 30 tablet 3  . lisinopril (PRINIVIL,ZESTRIL) 10 MG tablet Take 1 tablet (10 mg total) by mouth daily. 30 tablet 3  . metroNIDAZOLE (FLAGYL) 500 MG tablet Take 4 tablets (2,000 mg total) by mouth once. 4 tablet 0  . rosuvastatin (CRESTOR) 10 MG tablet Take 1 tablet (10 mg total) by mouth daily. 90 tablet 3  . triamcinolone cream (KENALOG) 0.1 % Apply 1 application topically 3 (three) times daily. Apply to affected areas of hands. 120 g 0  . venlafaxine XR (EFFEXOR-XR) 75 MG 24 hr capsule Take 1 capsule (75 mg total) by mouth daily. 90 capsule 0   No current facility-administered medications for this visit.     Review of Systems See HPI     Objective:   Physical Exam BP 172/86 mmHg  Pulse 91  Temp(Src) 98.4 F (36.9 C) (Oral)  Ht  (1.626 m)  Wt 234 lb 11.2 oz (106.459 kg)  BMI 40.27 kg/m2 Gen: NAD, alert, cooperative with exam, well-appearing CV: RRR, good S1/S2, no murmur,capillary refill brisk     Assessment & Plan:

## 2015-04-17 MED ORDER — LISINOPRIL 40 MG PO TABS: 40.0000 mg | ORAL_TABLET | Freq: Every day | ORAL | Status: DC

## 2015-04-17 NOTE — Assessment & Plan Note (Signed)
Uncontrolled but slowly improving.  - increased lisinopril 40 mg daily  - continue other current medications  - f/u in one month

## 2015-04-17 NOTE — Assessment & Plan Note (Signed)
Skin bx negative for immunofluorescents. Spoke with reading pathologist and it could be vasicular eczema. She continues to itch with no relief with current steroid.  - may need to try triamcinolone 0.5%  - if continues to show no improvement may need to try to refer to derm

## 2015-04-24 ENCOUNTER — Other Ambulatory Visit: Payer: Self-pay | Admitting: Family Medicine

## 2015-05-14 ENCOUNTER — Encounter: Payer: Self-pay | Admitting: Family Medicine

## 2015-05-14 ENCOUNTER — Ambulatory Visit (INDEPENDENT_AMBULATORY_CARE_PROVIDER_SITE_OTHER): Payer: Self-pay | Admitting: Family Medicine

## 2015-05-14 VITALS — BP 144/94 | HR 83 | Temp 98.7°F | Ht 64.0 in | Wt 223.4 lb

## 2015-05-14 DIAGNOSIS — B351 Tinea unguium: Secondary | ICD-10-CM

## 2015-05-14 DIAGNOSIS — Z114 Encounter for screening for human immunodeficiency virus [HIV]: Secondary | ICD-10-CM

## 2015-05-14 DIAGNOSIS — K219 Gastro-esophageal reflux disease without esophagitis: Secondary | ICD-10-CM

## 2015-05-14 DIAGNOSIS — L301 Dyshidrosis [pompholyx]: Secondary | ICD-10-CM

## 2015-05-14 DIAGNOSIS — I10 Essential (primary) hypertension: Secondary | ICD-10-CM

## 2015-05-14 DIAGNOSIS — Z23 Encounter for immunization: Secondary | ICD-10-CM

## 2015-05-14 DIAGNOSIS — E039 Hypothyroidism, unspecified: Secondary | ICD-10-CM

## 2015-05-14 DIAGNOSIS — Z1159 Encounter for screening for other viral diseases: Secondary | ICD-10-CM

## 2015-05-14 LAB — T4, FREE: Free T4: 0.51 ng/dL — ABNORMAL LOW (ref 0.80–1.80)

## 2015-05-14 LAB — TSH: TSH: 36.433 u[IU]/mL — AB (ref 0.350–4.500)

## 2015-05-14 LAB — T3, FREE: T3 FREE: 1.3 pg/mL — AB (ref 2.3–4.2)

## 2015-05-14 MED ORDER — TRIAMCINOLONE ACETONIDE 0.5 % EX OINT: 1.0000 "application " | TOPICAL_OINTMENT | Freq: Two times a day (BID) | CUTANEOUS | Status: DC

## 2015-05-14 MED ORDER — ESOMEPRAZOLE MAGNESIUM 40 MG PO CPDR: 40.0000 mg | DELAYED_RELEASE_CAPSULE | Freq: Every day | ORAL | Status: DC

## 2015-05-14 NOTE — Assessment & Plan Note (Signed)
Uncontrolled. But she did not take her medicines today Blood pressure is almost at goal - Continue current medicines - Follow-up in 3 months

## 2015-05-14 NOTE — Assessment & Plan Note (Signed)
Has been on Synthroid for 6 weeks now and recheck levels Will make changes based on levels - TSH, free T4, free T3

## 2015-05-14 NOTE — Patient Instructions (Signed)
Thank you for coming in,   I have made some referrals to dermatology and podiatry.   I have sent in a stronger topical steroid.   Please follow up with me in three months.   Please bring all of your medications with you to each visit.    Please feel free to call with any questions or concerns at any time, at [phone]. --Dr. Jordan Likes

## 2015-05-14 NOTE — Assessment & Plan Note (Signed)
There's been no improvement in her pruritus or vesicles with treatment thus far. Biopsies been taken and I've spoken with the reading pathologist that suggested the dyshidrotic eczema - Triamcinolone 0.5% sent to [facility] - Referral to dermatology

## 2015-05-14 NOTE — Progress Notes (Signed)
Subjective:    [name] - 64 y.o. female MRN [id number]  Date of birth: 08-19-1951  HPI  [name] is here for HTN and hypothyroidism.   HTN Disease Monitoring: Home BP Monitoring no  Medications:lisinopril,  Chest pain- no     Dyspnea- no Compliance-  Yes .  Lightheadedness-  no   Edema- no  Hypothyroidism: she has been compliant with medications She doesn't noticed a change since re-starting the synthroid.  She has been busy with her grandchildren.    Eczema: has still been using the topical steroid with no relief.  This has been going on for a few years now.  She has taken benadryl to help with sleeping.  The vesicles seem to keep appearing.   Toenails: she has onchomyococis on both feet.  She is unable to trim her nails due to them being so thick  They are causing her pain and discomfort.   Health Maintenance:  Health Maintenance Due  Topic Date Due  . Hepatitis C Screening  [birth year]  . COLONOSCOPY  10/04/2001  . PAP SMEAR  01/11/2008  . MAMMOGRAM  10/03/2010  . ZOSTAVAX  10/05/2011    -  reports that she has been smoking Cigarettes.  She has been smoking about 0.50 packs per day. She does not have any smokeless tobacco history on file. - Review of Systems: Per HPI. - Past Medical History: Patient Active Problem List   Diagnosis Date Noted  . Onychomycosis 05/14/2015  . HLD (hyperlipidemia) 04/01/2015  . Candidal intertrigo 09/19/2014  . Dyshidrotic eczema 10/21/2013  . Goiter, unspecified 09/28/2009  . Hypothyroidism 02/06/2009  . CHRONIC KIDNEY DISEASE STAGE III (MODERATE) 07/27/2007  . Major depressive disorder, recurrent episode 11/09/2006  . TOBACCO DEPENDENCE 11/09/2006  . HYPERTENSION, BENIGN SYSTEMIC 11/09/2006   - Medications: reviewed and updated Current Outpatient Prescriptions  Medication Sig Dispense Refill  . amLODipine (NORVASC) 10 MG tablet TAKE ONE TABLET BY MOUTH ONCE DAILY 30 tablet 0  . aspirin (ASPIRIN EC) 81 MG  EC tablet Take 1 tablet (81 mg total) by mouth daily. Swallow whole. 30 tablet 12  . esomeprazole (NEXIUM) 40 MG capsule Take 1 capsule (40 mg total) by mouth daily. 90 capsule 1  . hydrOXYzine (ATARAX/VISTARIL) 25 MG tablet Take 0.5-1 tablets (12.5-25 mg total) by mouth 3 (three) times daily as needed for itching. 50 tablet 1  . ketoconazole (NIZORAL) 2 % cream Apply 1 application topically daily. 15 g 0  . levothyroxine (SYNTHROID, LEVOTHROID) 50 MCG tablet Take 1 tablet (50 mcg total) by mouth daily before breakfast. 30 tablet 3  . lisinopril (PRINIVIL,ZESTRIL) 40 MG tablet Take 1 tablet (40 mg total) by mouth daily. 30 tablet 3  . metroNIDAZOLE (FLAGYL) 500 MG tablet Take 4 tablets (2,000 mg total) by mouth once. 4 tablet 0  . rosuvastatin (CRESTOR) 10 MG tablet Take 1 tablet (10 mg total) by mouth daily. 90 tablet 3  . triamcinolone ointment (KENALOG) 0.5 % Apply 1 application topically 2 (two) times daily. 30 g 1  . venlafaxine XR (EFFEXOR-XR) 75 MG 24 hr capsule Take 1 capsule (75 mg total) by mouth daily. 90 capsule 0   No current facility-administered medications for this visit.    Review of Systems See HPI     Objective:   Physical Exam BP 144/94 mmHg  Pulse 83  Temp(Src) 98.7 F (37.1 C) (Oral)  Ht  (1.626 m)  Wt 223 lb 6.4 oz (101.334 kg)  BMI 38.33 kg/m2  Gen: NAD, alert, cooperative with exam HEENT: Goiter not palpated CV: RRR, good S1/S2, no murmur, no edema,  Resp: CTABL, no wheezes, non-labored Skin: Several different vesicles at different stages occurring on her feet legs trunks arms and hands. Some areas of hyperpigmentation Neuro: no gross deficits.  MSK: onychomycosis occurring on bilateral great toes     Assessment & Plan:   Dyshidrotic eczema There's been no improvement in her pruritus or vesicles with treatment thus far. Biopsies been taken and I've spoken with the reading pathologist that suggested the dyshidrotic eczema - Triamcinolone 0.5% sent  to [facility] - Referral to dermatology  Hypothyroidism Has been on Synthroid for 6 weeks now and recheck levels Will make changes based on levels - TSH, free T4, free T3  HYPERTENSION, BENIGN SYSTEMIC Uncontrolled. But she did not take her medicines today Blood pressure is almost at goal - Continue current medicines - Follow-up in 3 months  Onychomycosis Severe and causing pain and discomfort as she is unable to wear tennis shoes easily  - will refer to podiatry

## 2015-05-15 ENCOUNTER — Telehealth: Payer: Self-pay | Admitting: Family Medicine

## 2015-05-15 DIAGNOSIS — K219 Gastro-esophageal reflux disease without esophagitis: Secondary | ICD-10-CM

## 2015-05-15 DIAGNOSIS — E039 Hypothyroidism, unspecified: Secondary | ICD-10-CM

## 2015-05-15 LAB — HIV ANTIBODY (ROUTINE TESTING W REFLEX): HIV 1&2 Ab, 4th Generation: NONREACTIVE

## 2015-05-15 LAB — HEPATITIS C ANTIBODY: HCV Ab: NEGATIVE

## 2015-05-15 MED ORDER — ESOMEPRAZOLE MAGNESIUM 40 MG PO CPDR: 40.0000 mg | DELAYED_RELEASE_CAPSULE | Freq: Every day | ORAL | Status: DC

## 2015-05-15 MED ORDER — LEVOTHYROXINE SODIUM 75 MCG PO TABS: 75.0000 ug | ORAL_TABLET | Freq: Every day | ORAL | Status: DC

## 2015-05-15 MED ORDER — TRIAMCINOLONE ACETONIDE 0.5 % EX OINT: 1.0000 "application " | TOPICAL_OINTMENT | Freq: Two times a day (BID) | CUTANEOUS | Status: DC

## 2015-05-15 NOTE — Assessment & Plan Note (Signed)
Severe and causing pain and discomfort as she is unable to wear tennis shoes easily  - will refer to podiatry

## 2015-05-15 NOTE — Telephone Encounter (Signed)
Spoke with patient about her labs. TSH is still increased. Will increase synthroid to 75 mcg. Advised to take it by itself and informed about side effects. Nexium and Triamcinolone faxed to [facility].   Myra Rude, MD PGY-3, [facility] 05/15/2015, 10:17 AM

## 2015-05-20 ENCOUNTER — Other Ambulatory Visit: Payer: Self-pay | Admitting: *Deleted

## 2015-05-20 ENCOUNTER — Telehealth: Payer: Self-pay | Admitting: *Deleted

## 2015-05-20 DIAGNOSIS — L309 Dermatitis, unspecified: Secondary | ICD-10-CM

## 2015-05-20 MED ORDER — DEXLANSOPRAZOLE 30 MG PO CPDR: 30.0000 mg | DELAYED_RELEASE_CAPSULE | Freq: Every day | ORAL | Status: DC

## 2015-05-20 MED ORDER — HYDROXYZINE HCL 25 MG PO TABS: 12.5000 mg | ORAL_TABLET | Freq: Three times a day (TID) | ORAL | Status: DC | PRN

## 2015-05-20 NOTE — Telephone Encounter (Signed)
Nexium is no longer available for free.  Please consider changing to Exultant.  Clovis Pu, RN

## 2015-05-20 NOTE — Telephone Encounter (Signed)
Changed nexium to dexilant and faxed to [facility].   Myra Rude, MD PGY-3, [facility] 05/20/2015, 4:49 PM

## 2015-06-09 ENCOUNTER — Ambulatory Visit: Payer: No Typology Code available for payment source | Admitting: Podiatry

## 2015-06-09 ENCOUNTER — Encounter: Payer: Self-pay | Admitting: Podiatry

## 2015-06-09 VITALS — BP 146/82 | HR 76 | Resp 12

## 2015-06-09 DIAGNOSIS — B351 Tinea unguium: Secondary | ICD-10-CM

## 2015-06-09 DIAGNOSIS — M79673 Pain in unspecified foot: Secondary | ICD-10-CM

## 2015-06-09 DIAGNOSIS — M79609 Pain in unspecified limb: Principal | ICD-10-CM

## 2015-06-09 DIAGNOSIS — B353 Tinea pedis: Secondary | ICD-10-CM

## 2015-06-09 NOTE — Progress Notes (Signed)
   Subjective:    Patient ID: [name], female    DOB: [birth year], 64 y.o.   MRN: [id number]  HPIThis patient presents to the office with two concerns.  She has problem with blisters on the inside of her left foot.  She says she has blisters which pop  and run.  Secondly she has thick and painful toenails both feet.  She was given ketoconazole by Dr. Jordan Likes..  She says her foot itches after a day.  She has been applying the medicine to blisters on her back and foot. She admits to sweating into her socks.  LT FOOT MEDIAL SIDE HAVE BLISTER AND DRAINAGE FOR 1 WEEK. FOOT IS WORSE ESPECIALLY WHEN THEY POP. TRIED OINTMENT-NO HELP  Review of Systems  Musculoskeletal: Positive for joint swelling and gait problem.  Skin: Positive for color change.       Objective:   Physical Exam GENERAL APPEARANCE: Alert, conversant. Appropriately groomed. No acute distress.  VASCULAR: Pedal pulses palpable at  [facility] and PT bilateral.  Capillary refill time is immediate to all digits,  Normal temperature gradient.  Digital hair growth is present bilateral  NEUROLOGIC: sensation is normal to 5.07 monofilament at 5/5 sites bilateral.  Light touch is intact bilateral, Muscle strength normal.  MUSCULOSKELETAL: acceptable muscle strength, tone and stability bilateral.  Intrinsic muscluature intact bilateral.  Rectus appearance of foot and digits noted bilateral.   DERMATOLOGIC: skin color, texture, and turgor are within normal limits.  No preulcerative lesions or ulcers  are seen, no interdigital maceration noted.  There is a dark blistery area medial aspect 1st MCJ left foot. NAILS Thick disfigured discolored nails both feet..         Assessment & Plan:  Tinea pedis left foot.  Onychomycosis B/L  IE  Debridement of long thick painful nails. Told her to use powders and change socks frequently.

## 2015-06-23 ENCOUNTER — Other Ambulatory Visit: Payer: Self-pay | Admitting: *Deleted

## 2015-06-23 DIAGNOSIS — F329 Major depressive disorder, single episode, unspecified: Secondary | ICD-10-CM

## 2015-06-23 DIAGNOSIS — F32A Depression, unspecified: Secondary | ICD-10-CM

## 2015-06-29 MED ORDER — VENLAFAXINE HCL ER 75 MG PO CP24: 75.0000 mg | ORAL_CAPSULE | Freq: Every day | ORAL | Status: DC

## 2015-09-15 ENCOUNTER — Ambulatory Visit (INDEPENDENT_AMBULATORY_CARE_PROVIDER_SITE_OTHER): Payer: No Typology Code available for payment source | Admitting: Podiatry

## 2015-09-15 ENCOUNTER — Encounter: Payer: Self-pay | Admitting: Podiatry

## 2015-09-15 DIAGNOSIS — B351 Tinea unguium: Secondary | ICD-10-CM

## 2015-09-15 DIAGNOSIS — M79609 Pain in unspecified limb: Principal | ICD-10-CM

## 2015-09-15 DIAGNOSIS — M79673 Pain in unspecified foot: Secondary | ICD-10-CM

## 2015-09-15 DIAGNOSIS — B353 Tinea pedis: Secondary | ICD-10-CM

## 2015-09-15 NOTE — Progress Notes (Signed)
Patient ID: [name], female   DOB: 1951/05/26, 65 y.o.   MRN: [id number] Complaint:  Visit Type: Patient returns to my office for continued preventative foot care services. Complaint: Patient states" my nails have grown long and thick and become painful to walk and wear shoes" . The patient presents for preventative foot care services. No changes to ROS  Podiatric Exam: Vascular: dorsalis pedis and posterior tibial pulses are palpable bilateral. Capillary return is immediate. Temperature gradient is WNL. Skin turgor WNL  Sensorium: Normal Semmes Weinstein monofilament test. Normal tactile sensation bilaterally. Nail Exam: Pt has thick disfigured discolored nails with subungual debris noted bilateral entire nail hallux through fifth toenails Ulcer Exam: There is no evidence of ulcer or pre-ulcerative changes or infection. Orthopedic Exam: Muscle tone and strength are WNL. No limitations in general ROM. No crepitus or effusions noted. Foot type and digits show no abnormalities. Bony prominences are unremarkable. Skin: No Porokeratosis. No infection or ulcers.  Persistant blister formation medial aspect left foot.  Diagnosis:  Onychomycosis, , Pain in right toe, pain in left toes  Treatment & Plan Procedures and Treatment: Consent by patient was obtained for treatment procedures. The patient understood the discussion of treatment and procedures well. All questions were answered thoroughly reviewed. Debridement of mycotic and hypertrophic toenails, 1 through 5 bilateral and clearing of subungual debris. No ulceration, no infection noted.  Return Visit-Office Procedure: Patient instructed to return to the office for a follow up visit 3 months for continued evaluation and treatment.    Helane GuntherGregory Analicia Skibinski DPM

## 2015-10-05 ENCOUNTER — Other Ambulatory Visit: Payer: Self-pay | Admitting: *Deleted

## 2015-10-06 MED ORDER — LEVOTHYROXINE SODIUM 75 MCG PO TABS
75.0000 ug | ORAL_TABLET | Freq: Every day | ORAL | Status: DC
Start: 2015-10-06 — End: 2015-11-06

## 2015-10-22 ENCOUNTER — Other Ambulatory Visit: Payer: Self-pay | Admitting: *Deleted

## 2015-10-22 DIAGNOSIS — L309 Dermatitis, unspecified: Secondary | ICD-10-CM

## 2015-10-22 MED ORDER — HYDROXYZINE HCL 25 MG PO TABS: 12.5000 mg | ORAL_TABLET | Freq: Three times a day (TID) | ORAL | Status: DC | PRN

## 2015-10-23 ENCOUNTER — Ambulatory Visit: Payer: Self-pay | Admitting: Family Medicine

## 2015-11-06 ENCOUNTER — Ambulatory Visit (INDEPENDENT_AMBULATORY_CARE_PROVIDER_SITE_OTHER): Payer: Self-pay | Admitting: Family Medicine

## 2015-11-06 ENCOUNTER — Encounter: Payer: Self-pay | Admitting: Family Medicine

## 2015-11-06 VITALS — BP 158/96 | HR 83 | Temp 98.0°F | Wt 221.8 lb

## 2015-11-06 DIAGNOSIS — N183 Chronic kidney disease, stage 3 unspecified: Secondary | ICD-10-CM

## 2015-11-06 DIAGNOSIS — L301 Dyshidrosis [pompholyx]: Secondary | ICD-10-CM

## 2015-11-06 DIAGNOSIS — F17200 Nicotine dependence, unspecified, uncomplicated: Secondary | ICD-10-CM

## 2015-11-06 DIAGNOSIS — I10 Essential (primary) hypertension: Secondary | ICD-10-CM

## 2015-11-06 DIAGNOSIS — E785 Hyperlipidemia, unspecified: Secondary | ICD-10-CM

## 2015-11-06 MED ORDER — LEVOTHYROXINE SODIUM 75 MCG PO TABS: 75.0000 ug | ORAL_TABLET | Freq: Every day | ORAL | Status: DC

## 2015-11-06 MED ORDER — DEXLANSOPRAZOLE 30 MG PO CPDR: 30.0000 mg | DELAYED_RELEASE_CAPSULE | Freq: Every day | ORAL | Status: DC

## 2015-11-06 MED ORDER — DOXEPIN HCL 25 MG PO CAPS: 25.0000 mg | ORAL_CAPSULE | Freq: Every day | ORAL | Status: DC

## 2015-11-06 MED ORDER — AMLODIPINE BESYLATE 10 MG PO TABS: 10.0000 mg | ORAL_TABLET | Freq: Every day | ORAL | Status: DC

## 2015-11-06 NOTE — Assessment & Plan Note (Signed)
Kidney function does not seem to be improving over time Influenced by her uncontrolled blood pressure - Referral to nephrology Place today - BMP today

## 2015-11-06 NOTE — Assessment & Plan Note (Signed)
Uncontrolled but is nonadherent with medications - Printed prescriptions of lisinopril and amlodipine.

## 2015-11-06 NOTE — Assessment & Plan Note (Signed)
Has been seen by dermatology and undergoing treatment.  She is still having constant itching which is not relieved by Benadryl or Atarax She takes effexor for her depression.   She hasn't taken effexor in over 4 weeks.  QTC on her EKG from 2011 measuring at 434 - Start doxepin and will not refilled Atarax on the Effexor - Discussed with Dr. Raymondo Band

## 2015-11-06 NOTE — Patient Instructions (Signed)
Thank you for coming in,   I will call or send a letter with the results from today.   Please call me if you can't get your medications.   Please think about quitting smoking.  This is very important for your health.  There are many things we can do to help you quit.  Let  us know if you are interested.  You can also call 1-800-QUIT-NOW [phone]) for free smoking cessation counseling.  Please bring all of your medications with you to each visit.   Sign up for My Chart to have easy access to your labs results, and communication with your Primary care physician   Please feel free to call with any questions or concerns at any time, at [phone]. --Dr. Jordan Likes  Diet Recommendations for High blood pressure   Starchy (carb) foods include: Bread, rice, pasta, potatoes, corn, crackers, bagels, muffins, all baked goods.   Protein foods include: Meat, fish, poultry, eggs, dairy foods, and beans such as pinto and kidney beans (beans also provide carbohydrate).   1. Eat at least 3 meals and 1-2 snacks per day. Never go more than 4-5 hours while awake without eating.  2. Limit starchy foods to TWO per meal and ONE per snack. ONE portion of a starchy  food is equal to the following:   - ONE slice of bread (or its equivalent, such as half of a hamburger bun).   - 1/2 cup of a "scoopable" starchy food such as potatoes or rice.   - 1 OUNCE (28 grams) of starchy snack foods such as crackers or pretzels (look on label).   - 15 grams of carbohydrate as shown on food label.  3. Both lunch and dinner should include a protein food, a carb food, and vegetables.   - Obtain twice as many veg's as protein or carbohydrate foods for both lunch and dinner.   - Try to keep frozen veg's on hand for a quick vegetable serving.     - Fresh or frozen veg's are best.  4. Breakfast should always include protein.

## 2015-11-06 NOTE — Assessment & Plan Note (Signed)
Stable. Continue Crestor. 

## 2015-11-06 NOTE — Assessment & Plan Note (Signed)
Still current smoker Interested in quitting but is not important to her at this time. - Counseled on quitting - Advised she should get the other people living her house to quit as well.

## 2015-11-06 NOTE — Progress Notes (Signed)
Subjective:    [name] - 65 y.o. female MRN [id number]  Date of birth: March 06, 1951  CC HTN and hypothyroidism   HPI  [name] is here for HTN and hypothyroidism.  HTN Disease Monitoring: Home BP Monitoring none  Medications:amlodipine, lisinopril  Chest pain- no     Dyspnea- no Compliance-  Intermittent adherence. Has not taken medication in over a month.  Lightheadedness-  no   Edema- no  Chronic kidney disease stage III Discussed with her about her worsening creatinine today I had the discussion about referring her to nephrology. She seems resistant to having to be seen by another doctor. She denies taking any NSAIDs She has never seen a nephrologist before  HYPERLIPIDEMIA Symptoms Chest pain on exertion:  no   . Leg claudication:   no Medications: Crestor  Compliance- adherent.  Right upper quadrant pain- no   Muscle aches- no     Component Value Date/Time   CHOL 135 04/01/2015 1104   TRIG 61 04/01/2015 1104   HDL 51 04/01/2015 1104   LDLDIRECT 103* 12/06/2010 1153   VLDL 12 04/01/2015 1104   CHOLHDL 2.6 04/01/2015 1104    Hypothyroidism:  Reports being asymptomatic  Lifelong in duration  She has a problem obtaining her synthroid  The last time she took synthroid was two months ago.  Reports intolerance to cold and diaphoresis but this is baseline for her.  Denies palpitations, chest pain or shortness of breath.   Eczema:  Dermatology pathology on 04/08/2016 and discussed with pathologist that reported most likely vesicular dermatitis.  This has been going on for a few years now.  She has taken benadryl and atarax to help with itching during her sleep.  She has been referred to dermatology that has injected a spot in her foot.  There is a bandage on her left foot today and she has been taking keflex. Still having constant itching occurring on her arms and legs.  Itching is about the same and occurs daily.   PMH: HTN, HLD,  hypothyroidism.  SH: current smoker. 1/2 PPD. Interested in quitting but not important to her. Two sons smoke as well.    Health Maintenance:  - reports having colonoscopy and reported normal.  Health Maintenance Due  Topic Date Due  . COLONOSCOPY  10/04/2001  . PAP SMEAR  01/11/2008  . MAMMOGRAM  10/03/2010  . ZOSTAVAX  10/05/2011  . TETANUS/TDAP  06/13/2015    Review of Systems See HPI     Objective:   Physical Exam BP 158/96 mmHg  Pulse 83  Temp(Src) 98 F (36.7 C) (Oral)  Wt 221 lb 12.8 oz (100.608 kg) Gen: NAD, alert, cooperative with exam,  CV: RRR, good S1/S2, no murmur, no edema Resp: CTABL, no wheezes, non-labored Skin: her left foot has a wrap on it with some drainage seen  Neuro: no gross deficits.  Psych:  alert and oriented  BP re-check: 152/98    Assessment & Plan:   Dyshidrotic eczema Has been seen by dermatology and undergoing treatment.  She is still having constant itching which is not relieved by Benadryl or Atarax She takes effexor for her depression.   She hasn't taken effexor in over 4 weeks.  QTC on her EKG from 2011 measuring at 434 - Start doxepin and will not refilled Atarax on the Effexor - Discussed with Dr. Raymondo Band  HYPERTENSION, BENIGN SYSTEMIC Uncontrolled but is nonadherent with medications - Printed prescriptions of lisinopril and amlodipine.   CHRONIC  KIDNEY DISEASE STAGE III (MODERATE) Kidney function does not seem to be improving over time Influenced by her uncontrolled blood pressure - Referral to nephrology Place today - BMP today  TOBACCO DEPENDENCE Still current smoker Interested in quitting but is not important to her at this time. - Counseled on quitting - Advised she should get the other people living her house to quit as well.  HLD (hyperlipidemia) Stable - Continue Crestor

## 2015-11-07 LAB — BASIC METABOLIC PANEL WITH GFR
BUN: 12 mg/dL (ref 7–25)
CALCIUM: 9 mg/dL (ref 8.6–10.4)
CO2: 27 mmol/L (ref 20–31)
Chloride: 107 mmol/L (ref 98–110)
Creat: 1.77 mg/dL — ABNORMAL HIGH (ref 0.50–0.99)
GFR, EST AFRICAN AMERICAN: 34 mL/min — AB (ref >=60)
GFR, EST NON AFRICAN AMERICAN: 30 mL/min — AB (ref >=60)
Glucose, Bld: 88 mg/dL (ref 65–99)
Potassium: 4 mmol/L (ref 3.5–5.3)
SODIUM: 141 mmol/L (ref 135–146)

## 2015-11-10 ENCOUNTER — Telehealth: Payer: Self-pay | Admitting: *Deleted

## 2015-11-10 NOTE — Telephone Encounter (Signed)
Pt calls requesting a different medication called into [facility] on [address].  The Dexlansoprazole 30 MG capsule will cost her $200 and she can't afford that.  Will forward to team [name], [name]

## 2015-11-11 MED ORDER — RANITIDINE HCL 150 MG PO CAPS: 150.0000 mg | ORAL_CAPSULE | Freq: Two times a day (BID) | ORAL | Status: DC

## 2015-11-11 NOTE — Telephone Encounter (Signed)
Will send in zantac since it is on the $4 list at [facility].   Myra Rude, MD PGY-3, [facility] 11/11/2015, 5:13 PM

## 2015-11-12 ENCOUNTER — Encounter: Payer: Self-pay | Admitting: Family Medicine

## 2015-11-12 NOTE — Telephone Encounter (Signed)
PT informed. Zimmerman Rumple, April D, CMA  

## 2015-11-25 ENCOUNTER — Ambulatory Visit (INDEPENDENT_AMBULATORY_CARE_PROVIDER_SITE_OTHER): Payer: Self-pay | Admitting: Family Medicine

## 2015-11-25 ENCOUNTER — Encounter: Payer: Self-pay | Admitting: Family Medicine

## 2015-11-25 VITALS — BP 142/88 | HR 110 | Temp 99.0°F | Wt 224.1 lb

## 2015-11-25 DIAGNOSIS — H02843 Edema of right eye, unspecified eyelid: Secondary | ICD-10-CM

## 2015-11-25 NOTE — Progress Notes (Signed)
   Subjective:    [name] - 65 y.o. female MRN [id number]  Date of birth: 08/30/1951  CC eyelid swelling   HPI  [name] is here for eyelid swelling.   Eyelid Swelling:  This started on Monday.  She woke up with her right eyelid swollen.  She was started on Doxepin and zantac last month but no new medications.  She denies any fever or chills.  Only having mild crusting in the morning. No changes in her vision.  No pain with ocular movements and no photophobia.  Has not prior history of this.  Denies any injury or trauma.  Unsure if something got into her eye.  Hasn't been exposed to any new pets  Denies any travel.   Health Maintenance:  Health Maintenance Due  Topic Date Due  . COLONOSCOPY  10/04/2001  . PAP SMEAR  01/11/2008  . MAMMOGRAM  10/03/2010  . ZOSTAVAX  10/05/2011  . TETANUS/TDAP  06/13/2015    Review of Systems See HPI     Objective:   Physical Exam BP 142/88 mmHg  Pulse 110  Temp(Src) 99 F (37.2 C) (Oral)  Wt 224 lb 1.6 oz (101.651 kg) Gen: NAD, alert, cooperative with exam,  HEENT: right upper eyelid edematous compared to left and lower lid on right, no discharge. Clear conjunctiva, no pain to palpation of the occular bones or of the lid, EOMI, PERRL, no debris noticed in the lid fornix in upper or lower lid with retraction.  Skin: no rashes, normal turgor  Neuro: no gross deficits.   Both eyes 20/30 Right 20/40 Left 20/30    Assessment & Plan:   Swelling of eyelid Limited to her upper eyelid  No pain, changes in vision or suggestion of infection such as orbital cellulitis  Doubtful for reaction to medication as it is unilateral.  Vision is normal  Possible for blepharitis  - advised warm compress  - encouraged to follow up if worsening or having vision changes  - may need topical ABX  - discussed with Dr. Jennette KettleNeal.

## 2015-11-25 NOTE — Patient Instructions (Signed)
Thank you for coming in,   Please try applying warm compress to your eye 4 times per day.   You don't have to take benadryl   If you have fevers, have a decrease in your vision or have pain with your eye movements then you need to let me know or get to the emergency department.   Please bring all of your medications with you to each visit.   Sign up for My Chart to have easy access to your labs results, and communication with your Primary care physician   Please feel free to call with any questions or concerns at any time, at [phone]. --Dr. Jordan LikesSchmitz

## 2015-11-26 DIAGNOSIS — H02849 Edema of unspecified eye, unspecified eyelid: Secondary | ICD-10-CM | POA: Insufficient documentation

## 2015-11-26 NOTE — Assessment & Plan Note (Signed)
Limited to her upper eyelid  No pain, changes in vision or suggestion of infection such as orbital cellulitis  Doubtful for reaction to medication as it is unilateral.  Vision is normal  Possible for blepharitis  - advised warm compress  - encouraged to follow up if worsening or having vision changes  - may need topical ABX  - discussed with Dr. Jennette KettleNeal.

## 2015-12-03 ENCOUNTER — Ambulatory Visit: Payer: Self-pay

## 2015-12-11 ENCOUNTER — Ambulatory Visit (INDEPENDENT_AMBULATORY_CARE_PROVIDER_SITE_OTHER): Payer: Self-pay | Admitting: Family Medicine

## 2015-12-11 ENCOUNTER — Encounter: Payer: Self-pay | Admitting: Family Medicine

## 2015-12-11 VITALS — BP 150/90 | HR 92 | Temp 98.2°F | Ht 63.5 in | Wt 222.3 lb

## 2015-12-11 DIAGNOSIS — H02843 Edema of right eye, unspecified eyelid: Secondary | ICD-10-CM

## 2015-12-11 DIAGNOSIS — E039 Hypothyroidism, unspecified: Secondary | ICD-10-CM

## 2015-12-11 NOTE — Progress Notes (Signed)
   Subjective:    Erin BlaseJoyce A Schloss - 65 y.o. female MRN [id number]  Date of birth: 1950-12-01  CC hypothyroidism   HPI  [name] is here for hypothyroidism.   Hypothyroidism:  She has been taking her synthroid for at least the last 6 weeks.  Denies any problems taking the medication  Asymptomatic today.  Has as history of goiter  Ultrasound of her neck in 2012 showed diffusely enlarged and inhomogeneous thyroid gland. No solid or cystic nodule seen.   Swelling of eyelid This has resolved.  It has cleared up without intervention. Denies any vision changes, photophobia, or eye drainage.    SH: current tobacco user  PMH: CKD, HLD, HTN   Health Maintenance:  - reports receiving colonoscopy 6 years ago  Health Maintenance Due  Topic Date Due  . COLONOSCOPY  10/04/2001  . PAP SMEAR  01/11/2008  . MAMMOGRAM  10/03/2010  . ZOSTAVAX  10/05/2011  . TETANUS/TDAP  06/13/2015    Review of Systems See HPI     Objective:   Physical Exam BP 150/90 mmHg  Pulse 92  Temp(Src) 98.2 F (36.8 C) (Oral)  Ht 5' 3.5" (1.613 m)  Wt 222 lb 4.8 oz (100.835 kg)  BMI 38.76 kg/m2 Gen: NAD, alert, cooperative with exam,  HEENT: NCAT,  clear conjunctiva,  supple neck CV: RRR, good S1/S2, no murmur,  Resp: CTABL, no wheezes, non-labored Skin: no rashes, normal turgor  Neuro: no gross deficits.     Assessment & Plan:   Hypothyroidism Has been taking synthroid so will check TSH studies today  - TSH and free t4 and Free t3  - results will determine if need to change dose   Swelling of eyelid Resolved

## 2015-12-11 NOTE — Assessment & Plan Note (Signed)
Resolved

## 2015-12-11 NOTE — Patient Instructions (Signed)
Thank you for coming in,   I will call or send a letter with the results from today.   You can try taking  Hyaluronic acid for your aches and pains. This is an over-the-counter  supplement  Please bring all of your medications with you to each visit.   Sign up for My Chart to have easy access to your labs results, and communication with your Primary care physician   Please feel free to call with any questions or concerns at any time, at [phone]. --Dr. Jordan LikesSchmitz

## 2015-12-11 NOTE — Assessment & Plan Note (Signed)
Has been taking synthroid so will check TSH studies today  - TSH and free t4 and Free t3  - results will determine if need to change dose

## 2015-12-12 LAB — T4, FREE: Free T4: 0.9 ng/dL (ref 0.8–1.8)

## 2015-12-12 LAB — TSH: TSH: 11.81 m[IU]/L — AB

## 2015-12-12 LAB — T3, FREE: T3, Free: 1.7 pg/mL — ABNORMAL LOW (ref 2.3–4.2)

## 2015-12-14 ENCOUNTER — Telehealth: Payer: Self-pay | Admitting: Family Medicine

## 2015-12-14 MED ORDER — LEVOTHYROXINE SODIUM 88 MCG PO TABS: 88.0000 ug | ORAL_TABLET | Freq: Every day | ORAL | Status: DC

## 2015-12-14 NOTE — Telephone Encounter (Signed)
Spoke with patient about her TSH and will increase her Synthroid to 88 mcg.   Erin RudeJeremy E Lucca Ballo, MD PGY-3, [place] [facility] 12/14/2015, 1:25 PM

## 2016-01-04 ENCOUNTER — Other Ambulatory Visit: Payer: Self-pay | Admitting: *Deleted

## 2016-01-04 DIAGNOSIS — F329 Major depressive disorder, single episode, unspecified: Secondary | ICD-10-CM

## 2016-01-04 DIAGNOSIS — F32A Depression, unspecified: Secondary | ICD-10-CM

## 2016-01-04 MED ORDER — VENLAFAXINE HCL ER 75 MG PO CP24: 75.0000 mg | ORAL_CAPSULE | Freq: Every day | ORAL | Status: DC

## 2016-04-03 ENCOUNTER — Other Ambulatory Visit: Payer: Self-pay | Admitting: Family Medicine

## 2016-04-22 ENCOUNTER — Other Ambulatory Visit: Payer: Self-pay | Admitting: Family Medicine

## 2016-09-28 ENCOUNTER — Other Ambulatory Visit: Payer: Self-pay | Admitting: Family Medicine

## 2016-09-30 NOTE — Telephone Encounter (Signed)
Refill requested for synthroid. Will provide 1 refill. Asked patient to please make an appointment to check her TSH and discuss her hypothyroidism. Patient states she will call to make an appointment.

## 2016-10-06 ENCOUNTER — Other Ambulatory Visit: Payer: Self-pay | Admitting: Family Medicine

## 2016-10-06 MED ORDER — LEVOTHYROXINE SODIUM 88 MCG PO TABS: 88.0000 ug | ORAL_TABLET | Freq: Every day | ORAL | 0 refills | Status: DC

## 2016-11-25 ENCOUNTER — Ambulatory Visit (INDEPENDENT_AMBULATORY_CARE_PROVIDER_SITE_OTHER): Payer: Medicare HMO | Admitting: Family Medicine

## 2016-11-25 DIAGNOSIS — F17200 Nicotine dependence, unspecified, uncomplicated: Secondary | ICD-10-CM | POA: Diagnosis not present

## 2016-11-25 DIAGNOSIS — K219 Gastro-esophageal reflux disease without esophagitis: Secondary | ICD-10-CM

## 2016-11-25 DIAGNOSIS — F329 Major depressive disorder, single episode, unspecified: Secondary | ICD-10-CM

## 2016-11-25 DIAGNOSIS — E785 Hyperlipidemia, unspecified: Secondary | ICD-10-CM

## 2016-11-25 DIAGNOSIS — I10 Essential (primary) hypertension: Secondary | ICD-10-CM

## 2016-11-25 DIAGNOSIS — F32A Depression, unspecified: Secondary | ICD-10-CM

## 2016-11-25 MED ORDER — RANITIDINE HCL 150 MG PO CAPS: 150.0000 mg | ORAL_CAPSULE | Freq: Two times a day (BID) | ORAL | 3 refills | Status: DC

## 2016-11-25 MED ORDER — VENLAFAXINE HCL ER 75 MG PO CP24: 75.0000 mg | ORAL_CAPSULE | Freq: Every day | ORAL | 0 refills | Status: DC

## 2016-11-25 MED ORDER — AMLODIPINE BESYLATE 10 MG PO TABS: 10.0000 mg | ORAL_TABLET | Freq: Every day | ORAL | 5 refills | Status: DC

## 2016-11-25 MED ORDER — ROSUVASTATIN CALCIUM 10 MG PO TABS: 10.0000 mg | ORAL_TABLET | Freq: Every day | ORAL | 3 refills | Status: DC

## 2016-11-25 NOTE — Progress Notes (Signed)
Subjective:    Patient ID: [name] , female   DOB: May 11, 1951 , 66 y.o..   MRN: [id number]  HPI  [name] is here for No chief complaint on file.   1. Hypothyroidism [name] is a 66 y.o. female who presents for follow up of hypothyroidism. Current symptoms: none . Patient denies change in energy level. She notes that a couple months ago she was on her medications for a month or so but she is taking them every day now.  2. Hypertension Blood pressure at home: Does not check.  Exercise: Notes that she hasn't been exercising because it's been cold. She notes when it warms up she is able to walk around the block a couple times a week.  Low salt diet: She notes she is compliant with low salt diet Medications: Has Amlodipine 10 mg daily but has been out for 1 month.  She notes that she hasn't had insurance so that's why she hasn't been to the doctor.  Is having dizziness and blurred vision for the last couple months. No LOC. She notes these symptoms happen when she gets up out of bed quickly.  ROS: Denies headache, nausea, vomiting, chest pain, abdominal pain or shortness of breath. BP Readings from Last 3 Encounters:  11/25/16 (!) 150/80  12/11/15 (!) 150/90  11/25/15 (!) 142/88   GERD:  SUBJECTIVE:  She has been experiencing heartburn for many year(s), recurrent over time. ROS: patient denies abdominal pain, chest pain, cough, wheezing or dysphagia. Social history: smoking 1/2 PPD. The Ranitidine does help.  Review of Systems: Per HPI. All other systems reviewed and are negative.  Past Medical History: Patient Active Problem List   Diagnosis Date Noted  . Swelling of eyelid 11/26/2015  . Onychomycosis 05/14/2015  . HLD (hyperlipidemia) 04/01/2015  . Candidal intertrigo 09/19/2014  . Dyshidrotic eczema 10/21/2013  . Goiter, unspecified 09/28/2009  . Hypothyroidism 02/06/2009  . CHRONIC KIDNEY DISEASE STAGE III (MODERATE) 07/27/2007  . Major depressive  disorder, recurrent episode (HCC) 11/09/2006  . TOBACCO DEPENDENCE 11/09/2006  . HYPERTENSION, BENIGN SYSTEMIC 11/09/2006  . GERD (gastroesophageal reflux disease) 11/09/2006    Medications: reviewed and updated Current Outpatient Prescriptions  Medication Sig Dispense Refill  . amLODipine (NORVASC) 10 MG tablet Take 1 tablet (10 mg total) by mouth daily. 30 tablet 5  . aspirin (ASPIRIN EC) 81 MG EC tablet Take 1 tablet (81 mg total) by mouth daily. Swallow whole. (Patient not taking: Reported on 11/25/2016) 30 tablet 12  . levothyroxine (SYNTHROID, LEVOTHROID) 88 MCG tablet Take 1 tablet (88 mcg total) by mouth daily. (Patient not taking: Reported on 11/25/2016) 90 tablet 0  . ranitidine (ZANTAC) 150 MG capsule Take 1 capsule (150 mg total) by mouth 2 (two) times daily. 60 capsule 3  . rosuvastatin (CRESTOR) 10 MG tablet Take 1 tablet (10 mg total) by mouth daily. 90 tablet 3  . triamcinolone ointment (KENALOG) 0.5 % Apply 1 application topically 2 (two) times daily. (Patient not taking: Reported on 11/25/2016) 30 g 1  . venlafaxine XR (EFFEXOR-XR) 75 MG 24 hr capsule Take 1 capsule (75 mg total) by mouth daily. 90 capsule 0   No current facility-administered medications for this visit.     Social Hx:  reports that she has been smoking Cigarettes.  She has been smoking about 0.50 packs per day. She uses smokeless tobacco.    Objective:   BP (!) 150/80 (BP Location: Left Arm, Patient Position: Sitting, Cuff Size: Normal)  Pulse 88   Temp 98.2 F (36.8 C) (Oral)   Ht 5' 3.5" (1.613 m)   Wt 228 lb 9.6 oz (103.7 kg)   SpO2 99%   BMI 39.86 kg/m  Physical Exam  Gen: NAD, alert, cooperative with exam, well-appearing Cardiac: Regular rate and rhythm, normal S1/S2, no murmur, no edema, capillary refill brisk  Respiratory: Clear to auscultation bilaterally, no wheezes, non-labored breathing Gastrointestinal: soft, non tender, non distended, bowel sounds present Neurological: no gross  deficits.  Psych: good insight, normal mood and affect  Assessment & Plan:  HYPERTENSION, BENIGN SYSTEMIC Uncontrolled. Blood pressure 150/80 today. Goal blood pressure is to be under 150/90. Discussed this with patient. Has not been taking any blood pressure medications for the last 2 months as she ran out. -Refill sent for amlodipine -Took lisinopril off patient's medication list as she is not taking this. -Follow-up in the next 2-3 weeks for a full annual physical exam and will check blood pressure at that visit -Perform CMP at next visit  Hypothyroidism No hyper-or hypothyroid symptoms. Would like to check TSH today however the lab is closed this afternoon. -Follow-up in 2-3 weeks and check TSH at that visit -Continue Synthroid  TOBACCO DEPENDENCE Continues to smoke. At a half pack per day. -Discussed importance of quitting -Patient not interested in quitting at this time  GERD (gastroesophageal reflux disease) Symptoms controlled with ranitidine -Sent refill for ranitidine   Meds ordered this encounter  Medications  . venlafaxine XR (EFFEXOR-XR) 75 MG 24 hr capsule    Sig: Take 1 capsule (75 mg total) by mouth daily.    Dispense:  90 capsule    Refill:  0  . rosuvastatin (CRESTOR) 10 MG tablet    Sig: Take 1 tablet (10 mg total) by mouth daily.    Dispense:  90 tablet    Refill:  3  . amLODipine (NORVASC) 10 MG tablet    Sig: Take 1 tablet (10 mg total) by mouth daily.    Dispense:  30 tablet    Refill:  5  . ranitidine (ZANTAC) 150 MG capsule    Sig: Take 1 capsule (150 mg total) by mouth 2 (two) times daily.    Dispense:  60 capsule    Refill:  3    Anders Simmondshristina Gambino, MD [facility], PGY-2

## 2016-11-25 NOTE — Assessment & Plan Note (Signed)
No hyper-or hypothyroid symptoms. Would like to check TSH today however the lab is closed this afternoon. -Follow-up in 2-3 weeks and check TSH at that visit -Continue Synthroid

## 2016-11-25 NOTE — Patient Instructions (Signed)
Thank you for coming in today, it was so nice to see you! Today we talked about:    Your blood pressure is a little high today: We have refilled your medications. Your goal blood pressure is for the top number to be under 150 and the bottom number 90.   I'd like to check your thyroid hormone in the next couple weeks. We can check this at your physical exam.   Please follow up in 2-3 weeks. You can schedule this appointment at the front desk before you leave or call the clinic.  Bring in all your medications or supplements to each appointment for review.   If you have any questions or concerns, please do not hesitate to call the office at [phone]. You can also message me directly via MyChart.   Sincerely,  Anders Simmondshristina Graham Hyun, MD

## 2016-11-25 NOTE — Assessment & Plan Note (Signed)
Continues to smoke. At a half pack per day. -Discussed importance of quitting -Patient not interested in quitting at this time

## 2016-11-25 NOTE — Assessment & Plan Note (Signed)
Symptoms controlled with ranitidine -Sent refill for ranitidine

## 2016-11-25 NOTE — Assessment & Plan Note (Addendum)
Uncontrolled. Blood pressure 150/80 today. Goal blood pressure is to be under 150/90. Discussed this with patient. Has not been taking any blood pressure medications for the last 2 months as she ran out. -Refill sent for amlodipine -Took lisinopril off patient's medication list as she is not taking this. -Follow-up in the next 2-3 weeks for a full annual physical exam and will check blood pressure at that visit -Perform CMP at next visit

## 2017-02-02 ENCOUNTER — Telehealth: Payer: Self-pay | Admitting: Family Medicine

## 2017-02-02 NOTE — Telephone Encounter (Signed)
Pre-visit call. Patient confirmed appointment. No further concerns at this time.  °

## 2017-02-03 ENCOUNTER — Ambulatory Visit (INDEPENDENT_AMBULATORY_CARE_PROVIDER_SITE_OTHER): Payer: Medicare HMO | Admitting: Family Medicine

## 2017-02-03 ENCOUNTER — Encounter: Payer: Self-pay | Admitting: Family Medicine

## 2017-02-03 VITALS — BP 125/80 | HR 78 | Temp 98.5°F | Wt 225.2 lb

## 2017-02-03 DIAGNOSIS — N183 Chronic kidney disease, stage 3 unspecified: Secondary | ICD-10-CM

## 2017-02-03 DIAGNOSIS — E785 Hyperlipidemia, unspecified: Secondary | ICD-10-CM | POA: Diagnosis not present

## 2017-02-03 DIAGNOSIS — M722 Plantar fascial fibromatosis: Secondary | ICD-10-CM

## 2017-02-03 DIAGNOSIS — E039 Hypothyroidism, unspecified: Secondary | ICD-10-CM

## 2017-02-03 DIAGNOSIS — F17200 Nicotine dependence, unspecified, uncomplicated: Secondary | ICD-10-CM | POA: Diagnosis not present

## 2017-02-03 DIAGNOSIS — R7989 Other specified abnormal findings of blood chemistry: Secondary | ICD-10-CM

## 2017-02-03 DIAGNOSIS — H547 Unspecified visual loss: Secondary | ICD-10-CM | POA: Diagnosis not present

## 2017-02-03 DIAGNOSIS — Z0000 Encounter for general adult medical examination without abnormal findings: Secondary | ICD-10-CM | POA: Diagnosis not present

## 2017-02-03 DIAGNOSIS — F329 Major depressive disorder, single episode, unspecified: Secondary | ICD-10-CM

## 2017-02-03 DIAGNOSIS — I10 Essential (primary) hypertension: Secondary | ICD-10-CM | POA: Diagnosis not present

## 2017-02-03 DIAGNOSIS — F32A Depression, unspecified: Secondary | ICD-10-CM

## 2017-02-03 DIAGNOSIS — Z23 Encounter for immunization: Secondary | ICD-10-CM

## 2017-02-03 MED ORDER — RANITIDINE HCL 150 MG PO CAPS: 150.0000 mg | ORAL_CAPSULE | Freq: Two times a day (BID) | ORAL | 3 refills | Status: DC

## 2017-02-03 MED ORDER — ROSUVASTATIN CALCIUM 10 MG PO TABS: 10.0000 mg | ORAL_TABLET | Freq: Every day | ORAL | 3 refills | Status: DC

## 2017-02-03 MED ORDER — VENLAFAXINE HCL ER 75 MG PO CP24: 75.0000 mg | ORAL_CAPSULE | Freq: Every day | ORAL | 0 refills | Status: DC

## 2017-02-03 MED ORDER — TRIAMCINOLONE ACETONIDE 0.5 % EX OINT: 1.0000 "application " | TOPICAL_OINTMENT | Freq: Two times a day (BID) | CUTANEOUS | 1 refills | Status: DC

## 2017-02-03 MED ORDER — ASPIRIN 81 MG PO TBEC: 81.0000 mg | DELAYED_RELEASE_TABLET | Freq: Every day | ORAL | 12 refills | Status: DC

## 2017-02-03 MED ORDER — AMLODIPINE BESYLATE 10 MG PO TABS: 10.0000 mg | ORAL_TABLET | Freq: Every day | ORAL | 5 refills | Status: DC

## 2017-02-03 MED ORDER — IBUPROFEN 600 MG PO TABS: 600.0000 mg | ORAL_TABLET | Freq: Three times a day (TID) | ORAL | 0 refills | Status: DC | PRN

## 2017-02-03 NOTE — Assessment & Plan Note (Signed)
Does have some cold intolerance, otherwise asymptomatic. TSH elevated at last visit to 11.81. -Continue Synthroid 88 g for now -Recheck TSH today

## 2017-02-03 NOTE — Assessment & Plan Note (Signed)
Creatinine continues to trend upwards and not improved. Creatinine last checked a couple  months ago and was 1.77.  GFR 30. Possibly due to previously uncontrolled blood pressure. -Referral to nephrology placed -BMP today

## 2017-02-03 NOTE — Assessment & Plan Note (Signed)
Present in both feet, tenderness to palpation in plantar aspect of both heels and pain with ambulation. -Discussed stretching exercises to help with plantar fasciitis and provided a packet of information -Trial of 600 mg ibuprofen twice a day for at least a week -Recommended heel inserts for plantar fasciitis, gave DME prescription for cell inserts to see if advanced home health care would cover this [facility]

## 2017-02-03 NOTE — Assessment & Plan Note (Addendum)
Controlled. BP 125/80 today. Goal BP is less than 150/90. Does have some signs of kidney injury with her last creatinine being elevated at 1.77, less likely due to hypertension as this has been controlled (see separate assessment and plan for kidney disease) -Continue amlodipine 10 mg daily  -Follow-up in 3 months next -BMP today

## 2017-02-03 NOTE — Progress Notes (Signed)
Subjective:  [name] is a 66 y.o. year old female who presents to office today for an annual physical examination.  Concerns today include:  1. Hypothyroidism: Current symptoms: sometimes gets chills and has cold intolerance. Patient denies change in energy level, diarrhea, heat / cold intolerance, nervousness, palpitations and weight changes. Symptoms have stabilized. She is taking synthroid   2. Hypertension Blood pressure at home: checks sometimes but the reading "varies" sometimes its 160's.  Exercise: Does not exercise Low salt diet: Compliant Medications: Compliant with Amlodipine daily  Side effects: None ROS: Denies headache, dizziness, visual changes, nausea, vomiting, chest pain, abdominal pain or shortness of breath. BP Readings from Last 3 Encounters:  02/03/17 125/80  11/25/16 (!) 150/80  12/11/15 (!) 150/90   2. CKD Stage 3: Patient notes that she has seen a nephrologist in the past but can't remember when, where, and who it was. She has been told that her kidneys are not doing as well as they used to. Denies any NSAIDS or diuretic use.   3. Heel Pain: All day off and on for the last month. Both heels. Marland Kitchen. Has been wearing the same shoes, has been wearing flats and some sneakers. Worse with ambulation. Has never had this before 1 month ago.   Review of Systems: Per HPI   General Healthcare: Medication Compliance: Yes, compliant with all meds Dx Hypertension: yes Dx Hyperlipidemia: yes Diabetes: no Dx Obesity: yes Weight Loss: no Physical Activity: none Urinary Incontinence: None Menstrual hx: Post menopausal   Social:  reports that she has been smoking Cigarettes.  She has been smoking about 0.50 packs per day. She uses smokeless tobacco. Driving: Drives herself, wears seatbelt  Alcohol Use: Occasionally 1 drink/month Tobacco Smokes 1/2 PPD  Other Drugs: No  Support and Life at Home: Has family but she notes that she has 2 mentally challenged adults  in the house who she cares for.  Advanced Directives: None Work: Retired, stays at home   Cancer:  Colorectal >> Colonoscopy: Does not want another, she cannot remember when her last one was Lung >> Tobacco Use: Yes             - If so, previous Low-Dose CT screen: No  Breast >> Mammogram: Has not had 1 in the last 5 years. Does not want another one   Cervical/Endometrial >>  - Postmenopausal: Yes - Hysterectomy: No - Vaginal Bleeding: No  Skin >> Suspicious lesions: No   Other: Osteoporosis: No  TDAP: due Pneumonia Vaccine: due  Health Maintenance Due  Topic Date Due  . COLONOSCOPY  10/04/2001  . PAP SMEAR  01/11/2008  . MAMMOGRAM  10/03/2010  . TETANUS/TDAP  06/13/2015  . DEXA SCAN  10/04/2016  . PNA vac Low Risk Adult (1 of 2 - PCV13) 10/04/2016    Past Medical History Past Medical History:  Diagnosis Date  . Hyperlipidemia   . Hypertension   . Thyroid disease    Patient Active Problem List   Diagnosis Date Noted  . Plantar fasciitis, bilateral 02/03/2017  . Encounter for annual physical exam 02/03/2017  . Swelling of eyelid 11/26/2015  . Onychomycosis 05/14/2015  . HLD (hyperlipidemia) 04/01/2015  . Candidal intertrigo 09/19/2014  . Dyshidrotic eczema 10/21/2013  . Goiter, unspecified 09/28/2009  . Hypothyroidism 02/06/2009  . CHRONIC KIDNEY DISEASE STAGE III (MODERATE) 07/27/2007  . Major depressive disorder, recurrent episode (HCC) 11/09/2006  . TOBACCO DEPENDENCE 11/09/2006  . HYPERTENSION, BENIGN SYSTEMIC 11/09/2006  . GERD (gastroesophageal reflux  disease) 11/09/2006    Medications- reviewed and updated Current Outpatient Prescriptions  Medication Sig Dispense Refill  . amLODipine (NORVASC) 10 MG tablet Take 1 tablet (10 mg total) by mouth daily. 30 tablet 5  . aspirin (ASPIRIN EC) 81 MG EC tablet Take 1 tablet (81 mg total) by mouth daily. Swallow whole. 30 tablet 12  . levothyroxine (SYNTHROID, LEVOTHROID) 88 MCG tablet Take 1 tablet (88 mcg  total) by mouth daily. 90 tablet 0  . ranitidine (ZANTAC) 150 MG capsule Take 1 capsule (150 mg total) by mouth 2 (two) times daily. 60 capsule 3  . rosuvastatin (CRESTOR) 10 MG tablet Take 1 tablet (10 mg total) by mouth daily. 90 tablet 3  . triamcinolone ointment (KENALOG) 0.5 % Apply 1 application topically 2 (two) times daily. 30 g 1  . venlafaxine XR (EFFEXOR-XR) 75 MG 24 hr capsule Take 1 capsule (75 mg total) by mouth daily. 90 capsule 0  . ibuprofen (ADVIL,MOTRIN) 600 MG tablet Take 1 tablet (600 mg total) by mouth every 8 (eight) hours as needed. 30 tablet 0   No current facility-administered medications for this visit.     Objective: BP 125/80 (BP Location: Left Arm, Patient Position: Sitting, Cuff Size: Normal)   Pulse 78   Temp 98.5 F (36.9 C) (Oral)   Wt 225 lb 3.2 oz (102.2 kg)   SpO2 94%   BMI 39.27 kg/m  Gen: In no acute distress, alert, cooperative with exam, well groomed, obese HEENT: NCAT, EOMI, PERRL, no teeth CV: Regular rate and rhythm, normal S1/S2, no murmur Resp: Clear to auscultation bilaterally, no wheezes, non-labored Abd: Soft, Non Tender, Non Distended, bowel sounds present, no guarding or organomegaly Ext: No edema, warm and well perfused MSK: No bony abnormalities, normal arch. Callus present on medial aspect of left mid foot. Tender to palpation of bilateral heels. Normal range of motion. Neurovascularly intact Neuro: Alert and oriented, No gross deficits, normal gait Psych: Normal mood and affect  Assessment/Plan:  HYPERTENSION, BENIGN SYSTEMIC Controlled. BP 125/80 today. Goal BP is less than 150/90. Does have some signs of kidney injury with her last creatinine being elevated at 1.77, less likely due to hypertension as this has been controlled (see separate assessment and plan for kidney disease) -Continue amlodipine 10 mg daily  -Follow-up in 3 months next -BMP today   Hypothyroidism Does have some cold intolerance, otherwise asymptomatic.  TSH elevated at last visit to 11.81. -Continue Synthroid 88 g for now -Recheck TSH today  CHRONIC KIDNEY DISEASE STAGE III (MODERATE) Creatinine continues to trend upwards and not improved. Creatinine last checked a couple  months ago and was 1.77.  GFR 30. Possibly due to previously uncontrolled blood pressure. -Referral to nephrology placed -BMP today  TOBACCO DEPENDENCE Continues to smoke a half pack per day. Not interested in quitting at this time.  -discussed benefits of smoking cessation  Plantar fasciitis, bilateral Present in both feet, tenderness to palpation in plantar aspect of both heels and pain with ambulation. -Discussed stretching exercises to help with plantar fasciitis and provided a packet of information -Trial of 600 mg ibuprofen twice a day for at least a week -Recommended heel inserts for plantar fasciitis, gave DME prescription for cell inserts to see if advanced home health care would cover this otherwise patient will go to [facility]  Encounter for annual physical exam Discussed health care maintenance issues today. Patient agreeable to receiving the tDAP and PCV 13 vaccine today. She also agreed to call and schedule mammogram.  Patient not agreeable to obtaining a colonoscopy at this time. Not interested in a DEXA scan at this time. She stated she would call back at another time to schedule a Pap smear. Ordered CBC, CMP, TSH, lipid panel.   Orders Placed This Encounter  Procedures  . DME Other see comment    Foot Heel Support Pads for Plantar Fasciitis  . Pneumococcal conjugate vaccine 13-valent IM  . Tdap vaccine greater than or equal to 7yo IM  . CBC with Differential  . Basic Metabolic Panel  . Lipid Panel  . TSH  . Ambulatory referral to Ophthalmology    Referral Priority:   Routine    Referral Type:   Consultation    Referral Reason:   Specialty Services Required    Requested Specialty:   Ophthalmology    Number of Visits Requested:   1  . Ambulatory  referral to Nephrology    Referral Priority:   Routine    Referral Type:   Consultation    Referral Reason:   Specialty Services Required    Requested Specialty:   Nephrology    Number of Visits Requested:   1      Anders Simmonds, MD [facility], PGY-2

## 2017-02-03 NOTE — Assessment & Plan Note (Signed)
Discussed health care maintenance issues today. Patient agreeable to receiving the tDAP and PCV 13 vaccine today. She also agreed to call and schedule mammogram. Patient not agreeable to obtaining a colonoscopy at this time. Not interested in a DEXA scan at this time. She stated she would call back at another time to schedule a Pap smear. Ordered CBC, CMP, TSH, lipid panel.

## 2017-02-03 NOTE — Assessment & Plan Note (Signed)
Continues to smoke a half pack per day. Not interested in quitting at this time.  -discussed benefits of smoking cessation

## 2017-02-03 NOTE — Patient Instructions (Signed)
Thank you for coming in today, it was so nice to see you! Today we talked about:    Heel Pain: This is likely from plantar fasciitis. I have printed out a hand out about this if you would like to read more. Please take 600 mg of Ibuprofen at least twice a day for the next week and then as needed for pain. Do the stretching exercises that we talked about. I have written you a prescription for heel inserts. You can take this to advanced home care and see if they can give them to you. Otherwise[facility]   Blood pressure: Your blood pressure looks good! Continue the amlodipine  Kidneys: Your kidney levels are rising. I have referred you to a nephrologist. Someone will call you to schedule this  Mammogram: Please call the number that I gave you to schedule an appointment  Continue to try and quit smoking, that will be the best thing you can do for your health  Please follow up in 3 months for blood pressure and heel pain follow up. You can schedule this appointment at the front desk before you leave or call the clinic.  Bring in all your medications or supplements to each appointment for review.   If we ordered any tests today, you will be notified via telephone of any abnormalities. If everything is normal you will get a letter in the mail.   If you have any questions or concerns, please do not hesitate to call the office at [phone]. You can also message me directly via MyChart.   Sincerely,  Anders Simmondshristina Courtnee Myer, MD

## 2017-02-04 LAB — TSH: TSH: 37.67 u[IU]/mL — AB (ref 0.450–4.500)

## 2017-02-04 LAB — BASIC METABOLIC PANEL
BUN / CREAT RATIO: 4 — AB (ref 12–28)
BUN: 7 mg/dL — ABNORMAL LOW (ref 8–27)
CHLORIDE: 104 mmol/L (ref 96–106)
CO2: 24 mmol/L (ref 18–29)
Calcium: 9.5 mg/dL (ref 8.7–10.3)
Creatinine, Ser: 1.56 mg/dL — ABNORMAL HIGH (ref 0.57–1.00)
GFR, EST AFRICAN AMERICAN: 40 mL/min/{1.73_m2} — AB (ref >59)
GFR, EST NON AFRICAN AMERICAN: 35 mL/min/{1.73_m2} — AB (ref >59)
Glucose: 89 mg/dL (ref 65–99)
POTASSIUM: 3.9 mmol/L (ref 3.5–5.2)
SODIUM: 142 mmol/L (ref 134–144)

## 2017-02-04 LAB — CBC WITH DIFFERENTIAL/PLATELET
BASOS: 1 %
Basophils Absolute: 0 10*3/uL (ref 0.0–0.2)
EOS (ABSOLUTE): 0.2 10*3/uL (ref 0.0–0.4)
EOS: 4 %
HEMATOCRIT: 36.5 % (ref 34.0–46.6)
Hemoglobin: 12.3 g/dL (ref 11.1–15.9)
IMMATURE GRANULOCYTES: 0 %
Immature Grans (Abs): 0 10*3/uL (ref 0.0–0.1)
LYMPHS ABS: 2.9 10*3/uL (ref 0.7–3.1)
Lymphs: 46 %
MCH: 29 pg (ref 26.6–33.0)
MCHC: 33.7 g/dL (ref 31.5–35.7)
MCV: 86 fL (ref 79–97)
MONOS ABS: 0.3 10*3/uL (ref 0.1–0.9)
Monocytes: 5 %
NEUTROS PCT: 44 %
Neutrophils Absolute: 2.7 10*3/uL (ref 1.4–7.0)
PLATELETS: 221 10*3/uL (ref 150–379)
RBC: 4.24 x10E6/uL (ref 3.77–5.28)
RDW: 15.9 % — AB (ref 12.3–15.4)
WBC: 6.2 10*3/uL (ref 3.4–10.8)

## 2017-02-04 LAB — LIPID PANEL
CHOL/HDL RATIO: 2.9 ratio (ref 0.0–4.4)
Cholesterol, Total: 151 mg/dL (ref 100–199)
HDL: 52 mg/dL (ref >39)
LDL Calculated: 78 mg/dL (ref 0–99)
Triglycerides: 103 mg/dL (ref 0–149)
VLDL Cholesterol Cal: 21 mg/dL (ref 5–40)

## 2017-02-07 ENCOUNTER — Telehealth: Payer: Self-pay | Admitting: Family Medicine

## 2017-02-07 ENCOUNTER — Encounter: Payer: Self-pay | Admitting: Family Medicine

## 2017-02-07 MED ORDER — LEVOTHYROXINE SODIUM 100 MCG PO TABS: 100.0000 ug | ORAL_TABLET | Freq: Every day | ORAL | 0 refills | Status: DC

## 2017-02-07 NOTE — Telephone Encounter (Signed)
Called patient again. She answered. Discussed lab results and elevated TSH. She states she has been taking her synthroid every day in the morning, does not miss a dose. Will increase synthroid from 88 mcg to 100 mcg. Asked her to please schedule an appointment with me in 6 weeks to follow up and check her TSH. Patient expressed good understanding.   Anders Simmondshristina Markisha Meding, MD [facility], PGY-2

## 2017-02-07 NOTE — Telephone Encounter (Signed)
Attempted to call patient because her TSH is high and synthroid needs to be increased. No answer and no option to leave voicemail. Will attempt to call back later today.   Anders Simmondshristina Gambino, MD [facility], PGY-2

## 2017-03-07 ENCOUNTER — Other Ambulatory Visit: Payer: Self-pay | Admitting: Family Medicine

## 2017-03-07 DIAGNOSIS — F32A Depression, unspecified: Secondary | ICD-10-CM

## 2017-03-07 DIAGNOSIS — F329 Major depressive disorder, single episode, unspecified: Secondary | ICD-10-CM

## 2017-03-24 DIAGNOSIS — H2511 Age-related nuclear cataract, right eye: Secondary | ICD-10-CM | POA: Diagnosis not present

## 2017-03-24 DIAGNOSIS — H40033 Anatomical narrow angle, bilateral: Secondary | ICD-10-CM | POA: Diagnosis not present

## 2017-03-24 DIAGNOSIS — H2512 Age-related nuclear cataract, left eye: Secondary | ICD-10-CM | POA: Diagnosis not present

## 2017-03-24 DIAGNOSIS — H2513 Age-related nuclear cataract, bilateral: Secondary | ICD-10-CM | POA: Diagnosis not present

## 2017-04-03 ENCOUNTER — Other Ambulatory Visit: Payer: Self-pay | Admitting: Family Medicine

## 2017-04-13 DIAGNOSIS — H2511 Age-related nuclear cataract, right eye: Secondary | ICD-10-CM | POA: Diagnosis not present

## 2017-05-08 ENCOUNTER — Other Ambulatory Visit: Payer: Self-pay | Admitting: Family Medicine

## 2017-06-20 ENCOUNTER — Other Ambulatory Visit: Payer: Self-pay | Admitting: Family Medicine

## 2017-06-20 DIAGNOSIS — F32A Depression, unspecified: Secondary | ICD-10-CM

## 2017-06-20 DIAGNOSIS — F329 Major depressive disorder, single episode, unspecified: Secondary | ICD-10-CM

## 2017-07-10 ENCOUNTER — Ambulatory Visit (INDEPENDENT_AMBULATORY_CARE_PROVIDER_SITE_OTHER): Payer: Medicare HMO | Admitting: Family Medicine

## 2017-07-10 ENCOUNTER — Encounter: Payer: Self-pay | Admitting: Family Medicine

## 2017-07-10 VITALS — BP 125/80 | HR 95 | Temp 98.3°F | Wt 231.4 lb

## 2017-07-10 DIAGNOSIS — L600 Ingrowing nail: Secondary | ICD-10-CM | POA: Diagnosis not present

## 2017-07-10 DIAGNOSIS — I10 Essential (primary) hypertension: Secondary | ICD-10-CM | POA: Diagnosis not present

## 2017-07-10 DIAGNOSIS — F32A Depression, unspecified: Secondary | ICD-10-CM

## 2017-07-10 DIAGNOSIS — E039 Hypothyroidism, unspecified: Secondary | ICD-10-CM

## 2017-07-10 DIAGNOSIS — E785 Hyperlipidemia, unspecified: Secondary | ICD-10-CM

## 2017-07-10 DIAGNOSIS — F329 Major depressive disorder, single episode, unspecified: Secondary | ICD-10-CM

## 2017-07-10 MED ORDER — VENLAFAXINE HCL ER 75 MG PO CP24: 75.0000 mg | ORAL_CAPSULE | Freq: Every day | ORAL | 0 refills | Status: DC

## 2017-07-10 MED ORDER — ROSUVASTATIN CALCIUM 10 MG PO TABS: 10.0000 mg | ORAL_TABLET | Freq: Every day | ORAL | 3 refills | Status: DC

## 2017-07-10 MED ORDER — RANITIDINE HCL 150 MG PO CAPS: 150.0000 mg | ORAL_CAPSULE | Freq: Two times a day (BID) | ORAL | 3 refills | Status: DC

## 2017-07-10 MED ORDER — DICLOFENAC SODIUM 1 % TD GEL: 2.0000 g | Freq: Four times a day (QID) | TRANSDERMAL | 1 refills | Status: DC

## 2017-07-10 MED ORDER — ACETAMINOPHEN 500 MG PO TABS: 500.0000 mg | ORAL_TABLET | Freq: Four times a day (QID) | ORAL | 0 refills | Status: DC | PRN

## 2017-07-10 MED ORDER — ASPIRIN 81 MG PO TBEC: 81.0000 mg | DELAYED_RELEASE_TABLET | Freq: Every day | ORAL | 12 refills | Status: DC

## 2017-07-10 NOTE — Patient Instructions (Signed)
Thank you for coming in today, it was so nice to see you! Today we talked about:    Your blood pressure is great, continue taking the amlodipine as prescribed  We will check your thyroid hormone today  Please follow up in one week for toenail removal  If we ordered any tests today, you will be notified via telephone of any abnormalities. If everything is normal you will get a letter in the mail.   If you have any questions or concerns, please do not hesitate to call the office at [phone]. You can also message me directly via MyChart.   Sincerely,  Anders Simmondshristina Lachlyn Vanderstelt, MD

## 2017-07-10 NOTE — Progress Notes (Signed)
Subjective:    Patient ID: [name] , female   DOB: 23-Sep-1950 , 66 y.o..   MRN: [id number]  HPI  [name] is here for  Chief Complaint  Patient presents with  . refills  . Toe Pain    left great toe    1. Hypertension Blood pressure at home: does not check Exercise: minimal Low salt diet: not adherent Medications: Compliant with amlodipine Side effects: none ROS: Denies headache, dizziness, visual changes, nausea, vomiting, chest pain, abdominal pain or shortness of breath. BP Readings from Last 3 Encounters:  07/17/17 138/88  07/10/17 125/80  02/03/17 125/80   2. Hypothyroidism Current symptoms: none . Patient denies change in energy level, diarrhea, heat / cold intolerance, nervousness, palpitations and weight changes. Symptoms have stabilized.  4. Left Great Toe pain Patient states that her left big toe has been hurting her for about 1 month. Has been soaking it in Epsom salt warm water a couple times a day without relief. Has seen podiatry in the past for this issue but has not seen them in 1 year. Denies any fevers or discharge from the toe.   Review of Systems: Per HPI. All other systems reviewed and are negative.  Health Maintenance Due  Topic Date Due  . COLONOSCOPY  10/04/2001  . PAP SMEAR  01/11/2008  . MAMMOGRAM  10/03/2010  . DEXA SCAN  10/04/2016    Past Medical History: Patient Active Problem List   Diagnosis Date Noted  . Plantar fasciitis, bilateral 02/03/2017  . Swelling of eyelid 11/26/2015  . Onychomycosis 05/14/2015  . HLD (hyperlipidemia) 04/01/2015  . Candidal intertrigo 09/19/2014  . Dyshidrotic eczema 10/21/2013  . Ingrown left big toenail 01/03/2013  . Goiter, unspecified 09/28/2009  . Hypothyroidism 02/06/2009  . CHRONIC KIDNEY DISEASE STAGE III (MODERATE) 07/27/2007  . Major depressive disorder, recurrent episode (HCC) 11/09/2006  . TOBACCO DEPENDENCE 11/09/2006  . HYPERTENSION, BENIGN SYSTEMIC 11/09/2006  . GERD  (gastroesophageal reflux disease) 11/09/2006    Medications: reviewed and updated Current Outpatient Medications  Medication Sig Dispense Refill  . acetaminophen (TYLENOL) 500 MG tablet Take 1 tablet (500 mg total) by mouth every 6 (six) hours as needed. 30 tablet 0  . acetaminophen-codeine (TYLENOL #3) 300-30 MG tablet Take 1 tablet every 6 (six) hours as needed by mouth for moderate pain. 10 tablet 0  . amLODipine (NORVASC) 10 MG tablet TAKE 1 TABLET BY MOUTH ONCE DAILY 90 tablet 1  . aspirin (ASPIRIN EC) 81 MG EC tablet Take 1 tablet (81 mg total) by mouth daily. Swallow whole. 30 tablet 12  . diclofenac sodium (VOLTAREN) 1 % GEL Apply 2 g topically 4 (four) times daily. 1 Tube 1  . ibuprofen (ADVIL,MOTRIN) 600 MG tablet Take 1 tablet (600 mg total) by mouth every 8 (eight) hours as needed. 30 tablet 0  . levothyroxine (SYNTHROID) 112 MCG tablet Take 1 tablet (112 mcg total) by mouth daily before breakfast. 30 tablet 1  . ranitidine (ZANTAC) 150 MG capsule Take 1 capsule (150 mg total) by mouth 2 (two) times daily. 60 capsule 3  . rosuvastatin (CRESTOR) 10 MG tablet Take 1 tablet (10 mg total) by mouth daily. 90 tablet 3  . triamcinolone ointment (KENALOG) 0.5 % Apply 1 application topically 2 (two) times daily. 30 g 1  . venlafaxine XR (EFFEXOR-XR) 75 MG 24 hr capsule Take 1 capsule (75 mg total) by mouth daily. 90 capsule 0   No current facility-administered medications for this visit.  Social Hx:  reports that she has been smoking cigarettes.  She has been smoking about 0.50 packs per day. She uses smokeless tobacco.   Objective:   BP 125/80 (BP Location: Right Arm, Patient Position: Sitting, Cuff Size: Normal)   Pulse 95   Temp 98.3 F (36.8 C) (Oral)   Wt 231 lb 6.4 oz (105 kg)   SpO2 97%   BMI 40.35 kg/m  Physical Exam  Gen: NAD, alert, cooperative with exam, well-appearing HEENT: NCAT, PERRL, clear conjunctiva, oropharynx clear, supple neck Cardiac: Regular rate and  rhythm, normal S1/S2, no murmur, no edema, capillary refill brisk  Respiratory: Clear to auscultation bilaterally, no wheezes, non-labored breathing Gastrointestinal: soft, non tender, non distended, bowel sounds present Skin: no rashes, normal turgor. Toenails discolored and hyperkeratotic. Neurological: no gross deficits.  Psych: good insight, normal mood and affect  Results for orders placed or performed in visit on 07/10/17  TSH  Result Value Ref Range   TSH 7.520 (H) 0.450 - 4.500 uIU/mL    Assessment & Plan:  HYPERTENSION, BENIGN SYSTEMIC Normotensive today. Goal BP <140/90.  - Continue amlodipine 10 mg daily  Hypothyroidism Asymptomatic today. TSH slightly elevated at 7.5. -Increase Synthroid to 112 g daily, prescription sent to pharmacy -Recheck TSH in 6 weeks  Ingrown left big toenail Ingrown toenail on left great toe today, no signs of infection. Toenail is discolored and has signs of  fungal infection.  -Patient will schedule appointment next week for great toenail removal -Continue Epsom salt soaks for now   Orders Placed This Encounter  Procedures  . TSH    Anders Simmondshristina Jaymen Fetch, MD [facility], PGY-3

## 2017-07-11 LAB — TSH: TSH: 7.52 u[IU]/mL — ABNORMAL HIGH (ref 0.450–4.500)

## 2017-07-12 MED ORDER — LEVOTHYROXINE SODIUM 112 MCG PO TABS: 112.0000 ug | ORAL_TABLET | Freq: Every day | ORAL | 1 refills | Status: DC

## 2017-07-17 ENCOUNTER — Ambulatory Visit (INDEPENDENT_AMBULATORY_CARE_PROVIDER_SITE_OTHER): Payer: Medicare HMO | Admitting: Family Medicine

## 2017-07-17 ENCOUNTER — Encounter: Payer: Self-pay | Admitting: Family Medicine

## 2017-07-17 DIAGNOSIS — L600 Ingrowing nail: Secondary | ICD-10-CM | POA: Diagnosis not present

## 2017-07-17 MED ORDER — ACETAMINOPHEN-CODEINE #3 300-30 MG PO TABS: 1.0000 | ORAL_TABLET | Freq: Four times a day (QID) | ORAL | 0 refills | Status: DC | PRN

## 2017-07-17 NOTE — Patient Instructions (Signed)
Fingernail or Toenail Removal, Adult, Care After  This sheet gives you information about how to care for yourself after your procedure. Your health care provider may also give you more specific instructions. If you have problems or questions, contact your health care provider.  What can I expect after the procedure?  After the procedure, it is common to have:  · Pain.  · Redness.  · Swelling.  · Soreness.    Follow these instructions at home:  · If you have a splint:  ? Do not put pressure on any part of the splint until it is fully hardened. This may take several hours.  ? Wear the splint as told by your health care provider. Remove it only as told by your health care provider.  ? Loosen the splint if your fingers or toes tingle, become numb, or turn cold and blue.  ? Keep the splint clean.  ? If the splint is not waterproof:  § Do not let it get wet.  § Cover it with a watertight covering when you take a bath or a shower.  Wound care    · Follow instructions from your health care provider about how to take care of your wound. Make sure you:  ? Wash your hands with soap and water before you change your bandage (dressing). If soap and water are not available, use hand sanitizer.  ? Change your dressing as told by your health care provider.  ? Keep your dressing dry until your health care provider says it can be removed.  ? Leave stitches (sutures), skin glue, or adhesive strips in place. These skin closures may need to stay in place for 2 weeks or longer. If adhesive strip edges start to loosen and curl up, you may trim the loose edges. Do not remove adhesive strips completely unless your health care provider tells you to do that.  · Check your wound every day for signs of infection. Check for:  ? More redness, swelling, or pain.  ? More fluid or blood.  ? Warmth.  ? Pus or a bad smell.  Managing pain, stiffness, and swelling  · Move your fingers or toes often to avoid stiffness and to lessen swelling.  · Raise  (elevate) the injured area above the level of your heart while you are sitting or lying down. You may need to keep your finger or toe raised or supported on a pillow for 24 hours or as told by your health care provider.  · Soak your hand or foot in warm, soapy water for 10-20 minutes, 3 times a day or as told by your health care provider.  Medicine  · Take over-the-counter and prescription medicines only as told by your health care provider.  · If you were prescribed an antibiotic medicine, use it as told by your health care provider. Do not stop using the antibiotic even if your condition improves.  General instructions  · If you were given a shoe to wear, wear it as told by your health care provider.  · Keep all follow-up visits as told by your health care provider. This is important.  Contact a health care provider if:  · You have more redness, swelling, or pain around your wound.  · You have more fluid or blood coming from your wound.  · Your wound feels warm to the touch.  · You have pus or a bad smell coming from your wound.  · You have a fever.  · Your   finger or toe looks blue or black.  This information is not intended to replace advice given to you by your health care provider. Make sure you discuss any questions you have with your health care provider.  Document Released: 09/19/2014 Document Revised: 04/27/2016 Document Reviewed: 03/07/2016  Elsevier Interactive Patient Education © 2018 [employer]

## 2017-07-17 NOTE — Progress Notes (Signed)
   Subjective:    Patient ID: [name] , female   DOB: 13-Nov-1950 , 66 y.o..   MRN: [id number]  HPI  [name] is here for  Chief Complaint  Patient presents with  . Toe Nail Removal   1. Toenail removal: Patient presents today for removal of left great toenail. She notes that she is continue to do Epsom salt soaks and continues to have pain in that toe. Denies any discharge or redness on the skin.  Review of Systems: Per HPI.   Objective:   BP 138/88   Pulse 86   Temp 98.9 F (37.2 C) (Oral)   Ht 5\' 4"  (1.626 m)   Wt 229 lb 9.6 oz (104.1 kg)   SpO2 97%   BMI 39.41 kg/m  Physical Exam  Gen: NAD, alert, cooperative with exam, well-appearing Left great toe nail is discolored and hyperkeratotic, ingrown edges with tenderness to palpation  Left Great Toenail Removal Procedure:  Informed consent is obtained. Using 1% plain lidocaine, a ring block was done (6 cc total). Using a tourniquet for hemostasis and sterile instruments, I freed the nail from the nail bed and removed the nail including the ingrown portion to the level of the nail skin fold. This was well tolerated, minimal bleeding however silver nitrate was used for more hemostasis Antibiotic ointment and a dressing are applied.   Assessment & Plan:  Ingrown left big toenail Left great toenail was removed completely today without complication. -Remove the dressing tomorrow and begin frequent soaks -Call for pain, erythema, fever or bleeding.  -Wound care and dressing instructions are given -Over-the-counter ibuprofen when necessary for pain, Tylenol with Codeine #3, 1 tab po q6h prn for more severe pain - Follow-up as needed   Meds ordered this encounter  Medications  . acetaminophen-codeine (TYLENOL #3) 300-30 MG tablet    Sig: Take 1 tablet every 6 (six) hours as needed by mouth for moderate pain.    Dispense:  10 tablet    Refill:  0    Anders Simmondshristina Gambino, MD [facility], PGY-3

## 2017-07-18 NOTE — Assessment & Plan Note (Signed)
Asymptomatic today. TSH slightly elevated at 7.5. -Increase Synthroid to 112 g daily, prescription sent to pharmacy -Recheck TSH in 6 weeks

## 2017-07-18 NOTE — Assessment & Plan Note (Addendum)
Left great toenail was removed completely today without complication. -Remove the dressing tomorrow and begin frequent soaks -Call for pain, erythema, fever or bleeding.  -Wound care and dressing instructions are given -Over-the-counter ibuprofen when necessary for pain, Tylenol with Codeine #3, 1 tab po q6h prn for more severe pain - Follow-up as needed

## 2017-07-18 NOTE — Assessment & Plan Note (Signed)
Ingrown toenail on left great toe today, no signs of infection. Toenail is discolored and has signs of  fungal infection.  -Patient will schedule appointment next week for great toenail removal -Continue Epsom salt soaks for now

## 2017-07-18 NOTE — Assessment & Plan Note (Signed)
Normotensive today. Goal BP <140/90.  - Continue amlodipine 10 mg daily

## 2017-09-27 ENCOUNTER — Other Ambulatory Visit: Payer: Self-pay | Admitting: *Deleted

## 2017-09-28 MED ORDER — LEVOTHYROXINE SODIUM 112 MCG PO TABS: 112.0000 ug | ORAL_TABLET | Freq: Every day | ORAL | 1 refills | Status: DC

## 2018-01-09 ENCOUNTER — Encounter: Payer: Self-pay | Admitting: Family Medicine

## 2018-01-09 ENCOUNTER — Other Ambulatory Visit: Payer: Self-pay

## 2018-01-09 ENCOUNTER — Ambulatory Visit (INDEPENDENT_AMBULATORY_CARE_PROVIDER_SITE_OTHER): Payer: Medicare HMO | Admitting: Family Medicine

## 2018-01-09 VITALS — BP 138/85 | HR 77 | Temp 99.0°F | Ht 64.0 in | Wt 233.2 lb

## 2018-01-09 DIAGNOSIS — F17200 Nicotine dependence, unspecified, uncomplicated: Secondary | ICD-10-CM | POA: Diagnosis not present

## 2018-01-09 DIAGNOSIS — Z1231 Encounter for screening mammogram for malignant neoplasm of breast: Secondary | ICD-10-CM | POA: Diagnosis not present

## 2018-01-09 DIAGNOSIS — E785 Hyperlipidemia, unspecified: Secondary | ICD-10-CM | POA: Diagnosis not present

## 2018-01-09 DIAGNOSIS — E039 Hypothyroidism, unspecified: Secondary | ICD-10-CM

## 2018-01-09 DIAGNOSIS — F32A Depression, unspecified: Secondary | ICD-10-CM

## 2018-01-09 DIAGNOSIS — Z1239 Encounter for other screening for malignant neoplasm of breast: Secondary | ICD-10-CM

## 2018-01-09 DIAGNOSIS — Z6841 Body Mass Index (BMI) 40.0 and over, adult: Secondary | ICD-10-CM | POA: Diagnosis not present

## 2018-01-09 DIAGNOSIS — I10 Essential (primary) hypertension: Secondary | ICD-10-CM

## 2018-01-09 DIAGNOSIS — F329 Major depressive disorder, single episode, unspecified: Secondary | ICD-10-CM | POA: Diagnosis not present

## 2018-01-09 MED ORDER — VENLAFAXINE HCL ER 75 MG PO CP24: 75.0000 mg | ORAL_CAPSULE | Freq: Every day | ORAL | 0 refills | Status: DC

## 2018-01-09 MED ORDER — ASPIRIN 81 MG PO TBEC: 81.0000 mg | DELAYED_RELEASE_TABLET | Freq: Every day | ORAL | 12 refills | Status: DC

## 2018-01-09 MED ORDER — AMLODIPINE BESYLATE 10 MG PO TABS: 10.0000 mg | ORAL_TABLET | Freq: Every day | ORAL | 1 refills | Status: DC

## 2018-01-09 MED ORDER — RANITIDINE HCL 150 MG PO CAPS: 150.0000 mg | ORAL_CAPSULE | Freq: Two times a day (BID) | ORAL | 3 refills | Status: DC

## 2018-01-09 MED ORDER — LEVOTHYROXINE SODIUM 112 MCG PO TABS: 112.0000 ug | ORAL_TABLET | Freq: Every day | ORAL | 1 refills | Status: DC

## 2018-01-09 MED ORDER — TRIAMCINOLONE ACETONIDE 0.5 % EX OINT
1.0000 | TOPICAL_OINTMENT | Freq: Two times a day (BID) | CUTANEOUS | 1 refills | Status: DC
Start: 2018-01-09 — End: 2019-04-16

## 2018-01-09 MED ORDER — ROSUVASTATIN CALCIUM 10 MG PO TABS: 10.0000 mg | ORAL_TABLET | Freq: Every day | ORAL | 3 refills | Status: DC

## 2018-01-09 NOTE — Progress Notes (Signed)
Subjective:    Patient ID: [name] , female   DOB: Jun 05, 1951 , 67 y.o..   MRN: [id number]  HPI  [name] is 67 yo F with PMH of hypothyroidism, CKD stage III, tobacco abuse, major depression, hypertension, GERD here for  Chief Complaint  Patient presents with  . Medication Refill    1. Hypothyroidism: Current symptoms: none . Patient denies change in energy level, diarrhea, heat / cold intolerance, nervousness, palpitations and weight changes. Symptoms have been well-controlled.  2. Hypertension Blood pressure at home: sometimes checks, doesn't remember the numbers Exercise: walks sometimes Medications: Compliant with amlodipine Side effects: none ROS: Denies headache, dizziness, visual changes, nausea, vomiting, chest pain, abdominal pain or shortness of breath. BP Readings from Last 3 Encounters:  01/09/18 138/85  07/17/17 138/88  07/10/17 125/80   3. Tobacco Abuse: Patient smokes a half a pack a day.  Has quit in the past, not ready to quit now.  Notes that barriers to quitting or stress  Review of Systems: Per HPI.    Past Medical History: Patient Active Problem List   Diagnosis Date Noted  . Plantar fasciitis, bilateral 02/03/2017  . Onychomycosis 05/14/2015  . Candidal intertrigo 09/19/2014  . Dyshidrotic eczema 10/21/2013  . Ingrown left big toenail 01/03/2013  . Goiter, unspecified 09/28/2009  . Hypothyroidism 02/06/2009  . CHRONIC KIDNEY DISEASE STAGE III (MODERATE) 07/27/2007  . Major depressive disorder, recurrent episode (HCC) 11/09/2006  . TOBACCO DEPENDENCE 11/09/2006  . HYPERTENSION, BENIGN SYSTEMIC 11/09/2006  . GERD (gastroesophageal reflux disease) 11/09/2006    Medications: reviewed and updated Current Outpatient Medications  Medication Sig Dispense Refill  . amLODipine (NORVASC) 10 MG tablet Take 1 tablet (10 mg total) by mouth daily. 90 tablet 1  . aspirin (ASPIRIN EC) 81 MG EC tablet Take 1 tablet (81 mg total) by mouth  daily. Swallow whole. 30 tablet 12  . diclofenac sodium (VOLTAREN) 1 % GEL Apply 2 g topically 4 (four) times daily. 1 Tube 1  . ibuprofen (ADVIL,MOTRIN) 600 MG tablet Take 1 tablet (600 mg total) by mouth every 8 (eight) hours as needed. 30 tablet 0  . levothyroxine (SYNTHROID) 112 MCG tablet Take 1 tablet (112 mcg total) by mouth daily before breakfast. 30 tablet 1  . ranitidine (ZANTAC) 150 MG capsule Take 1 capsule (150 mg total) by mouth 2 (two) times daily. 60 capsule 3  . rosuvastatin (CRESTOR) 10 MG tablet Take 1 tablet (10 mg total) by mouth daily. 90 tablet 3  . triamcinolone ointment (KENALOG) 0.5 % Apply 1 application topically 2 (two) times daily. 30 g 1  . venlafaxine XR (EFFEXOR-XR) 75 MG 24 hr capsule Take 1 capsule (75 mg total) by mouth daily. 90 capsule 0   No current facility-administered medications for this visit.     Social Hx:  reports that she has been smoking cigarettes.  She has been smoking about 0.50 packs per day. She uses smokeless tobacco.   Objective:   BP 138/85   Pulse 77   Temp 99 F (37.2 C) (Oral)   Ht  (1.626 m)   Wt 233 lb 3.2 oz (105.8 kg)   SpO2 98%   BMI 40.03 kg/m  Physical Exam  Gen: NAD, alert, cooperative with exam, well-appearing Cardiac: Regular rate and rhythm, normal S1/S2, no murmur, no edema, capillary refill brisk  Respiratory: Clear to auscultation bilaterally, no wheezes, non-labored breathing Psych: good insight, normal mood and affect  Assessment & Plan:   1.  Hyperlipidemia, unspecified hyperlipidemia type: Last lipid panel was normal - rosuvastatin (CRESTOR) 10 MG tablet; Take 1 tablet (10 mg total) by mouth daily.  Dispense: 90 tablet; Refill: 3  2. Depression, unspecified depression type: Stable - venlafaxine XR (EFFEXOR-XR) 75 MG 24 hr capsule; Take 1 capsule (75 mg total) by mouth daily.  Dispense: 90 capsule; Refill: 0  3. Essential hypertension: Controlled and at goal - amLODipine (NORVASC) 10 MG tablet;  Take 1 tablet (10 mg total) by mouth daily.  Dispense: 90 tablet; Refill: 1 - aspirin (ASPIRIN EC) 81 MG EC tablet; Take 1 tablet (81 mg total) by mouth daily. Swallow whole.  Dispense: 30 tablet; Refill: 12 - Basic Metabolic Panel -Follow-up in 3 months  4. Breast cancer screening - MM Digital Screening; Future  5. Hypothyroidism, unspecified type: Last TSH was 7.5 in October 2018.  At that time her Synthroid was increased - levothyroxine (SYNTHROID) 112 MCG tablet; Take 1 tablet (112 mcg total) by mouth daily before breakfast.  Dispense: 30 tablet; Refill: 1 - TSH rechecked today  6.  Tobacco abuse; patient is continuing to smoke 1/2 pack/day.  Not interested in quitting at this time -Continue tobacco cessation discussion at next visit  Anders Simmonds, MD [facility], PGY-3

## 2018-01-09 NOTE — Patient Instructions (Signed)
Thank you for coming in today, it was so nice to see you! Today we talked about:    We have refilled all your medications  We are checking your thyroid hormone today as well as your kidneys  Please follow up in 3 months.   If we ordered any tests today, you will be notified via telephone of any abnormalities. If everything is normal you will get a letter in the mail.   If you have any questions or concerns, please do not hesitate to call the office at [phone]. You can also message me directly via MyChart.   Sincerely,  Anders Simmonds, MD

## 2018-01-10 ENCOUNTER — Telehealth: Payer: Self-pay | Admitting: Family Medicine

## 2018-01-10 ENCOUNTER — Encounter: Payer: Self-pay | Admitting: Family Medicine

## 2018-01-10 LAB — BASIC METABOLIC PANEL
BUN/Creatinine Ratio: 5 — ABNORMAL LOW (ref 12–28)
BUN: 8 mg/dL (ref 8–27)
CALCIUM: 9.8 mg/dL (ref 8.7–10.3)
CHLORIDE: 106 mmol/L (ref 96–106)
CO2: 23 mmol/L (ref 20–29)
Creatinine, Ser: 1.47 mg/dL — ABNORMAL HIGH (ref 0.57–1.00)
GFR calc Af Amer: 43 mL/min/{1.73_m2} — ABNORMAL LOW (ref >59)
GFR calc non Af Amer: 37 mL/min/{1.73_m2} — ABNORMAL LOW (ref >59)
GLUCOSE: 91 mg/dL (ref 65–99)
POTASSIUM: 3.8 mmol/L (ref 3.5–5.2)
SODIUM: 143 mmol/L (ref 134–144)

## 2018-01-10 LAB — TSH: TSH: 9.08 u[IU]/mL — ABNORMAL HIGH (ref 0.450–4.500)

## 2018-01-10 NOTE — Progress Notes (Signed)
Letter sent.

## 2018-01-10 NOTE — Telephone Encounter (Signed)
Called patient to discuss labs. We will need to increase her synthroid. No answer. Left voicemail. Will wait to talk to her first before any changes are made. Will also send a letter to the house.  Anders Simmonds, MD [facility], PGY-3

## 2018-03-21 ENCOUNTER — Ambulatory Visit
Admission: RE | Admit: 2018-03-21 | Discharge: 2018-03-21 | Disposition: A | Discharge status: Home / Self Care | Payer: Medicare HMO | Source: Ambulatory Visit | Attending: Family Medicine | Admitting: Family Medicine

## 2018-03-21 DIAGNOSIS — Z1231 Encounter for screening mammogram for malignant neoplasm of breast: Secondary | ICD-10-CM | POA: Diagnosis not present

## 2018-03-21 DIAGNOSIS — Z1239 Encounter for other screening for malignant neoplasm of breast: Secondary | ICD-10-CM

## 2018-03-30 ENCOUNTER — Other Ambulatory Visit: Payer: Self-pay | Admitting: Family Medicine

## 2018-03-30 DIAGNOSIS — E039 Hypothyroidism, unspecified: Secondary | ICD-10-CM

## 2018-03-30 NOTE — Telephone Encounter (Signed)
Error

## 2018-04-04 ENCOUNTER — Other Ambulatory Visit: Payer: Self-pay | Admitting: Family Medicine

## 2018-04-04 DIAGNOSIS — E039 Hypothyroidism, unspecified: Secondary | ICD-10-CM

## 2018-05-04 DIAGNOSIS — H2512 Age-related nuclear cataract, left eye: Secondary | ICD-10-CM | POA: Diagnosis not present

## 2018-05-07 ENCOUNTER — Ambulatory Visit: Payer: Medicare HMO | Admitting: Family Medicine

## 2018-05-07 DIAGNOSIS — Z01818 Encounter for other preprocedural examination: Secondary | ICD-10-CM | POA: Diagnosis not present

## 2018-05-07 DIAGNOSIS — H2512 Age-related nuclear cataract, left eye: Secondary | ICD-10-CM | POA: Diagnosis not present

## 2018-05-07 DIAGNOSIS — H25812 Combined forms of age-related cataract, left eye: Secondary | ICD-10-CM | POA: Diagnosis not present

## 2018-05-17 ENCOUNTER — Encounter: Payer: Self-pay | Admitting: Family Medicine

## 2018-05-18 ENCOUNTER — Other Ambulatory Visit: Payer: Self-pay

## 2018-05-18 ENCOUNTER — Ambulatory Visit (INDEPENDENT_AMBULATORY_CARE_PROVIDER_SITE_OTHER): Payer: Medicare HMO | Admitting: Family Medicine

## 2018-05-18 ENCOUNTER — Encounter: Payer: Self-pay | Admitting: Family Medicine

## 2018-05-18 VITALS — BP 156/88 | HR 69 | Temp 98.6°F | Wt 243.4 lb

## 2018-05-18 DIAGNOSIS — I10 Essential (primary) hypertension: Secondary | ICD-10-CM | POA: Diagnosis not present

## 2018-05-18 DIAGNOSIS — F17200 Nicotine dependence, unspecified, uncomplicated: Secondary | ICD-10-CM

## 2018-05-18 DIAGNOSIS — N183 Chronic kidney disease, stage 3 unspecified: Secondary | ICD-10-CM

## 2018-05-18 DIAGNOSIS — E039 Hypothyroidism, unspecified: Secondary | ICD-10-CM | POA: Diagnosis not present

## 2018-05-18 DIAGNOSIS — R631 Polydipsia: Secondary | ICD-10-CM | POA: Diagnosis not present

## 2018-05-18 LAB — POCT GLYCOSYLATED HEMOGLOBIN (HGB A1C): Hemoglobin A1C: 6.1 % — AB (ref 4.0–5.6)

## 2018-05-18 MED ORDER — DICLOFENAC SODIUM 1 % TD GEL: 2.0000 g | Freq: Four times a day (QID) | TRANSDERMAL | 1 refills | Status: DC

## 2018-05-18 MED ORDER — LEVOTHYROXINE SODIUM 112 MCG PO TABS: 112.0000 ug | ORAL_TABLET | Freq: Every day | ORAL | 0 refills | Status: DC

## 2018-05-18 MED ORDER — ASPIRIN 81 MG PO TBEC: 81.0000 mg | DELAYED_RELEASE_TABLET | Freq: Every day | ORAL | 3 refills | Status: DC

## 2018-05-18 MED ORDER — AMLODIPINE BESYLATE 10 MG PO TABS: 10.0000 mg | ORAL_TABLET | Freq: Every day | ORAL | 3 refills | Status: DC

## 2018-05-18 NOTE — Patient Instructions (Signed)
Go to the lab today    It was wonderful to see you today.  Thank you for choosing [facility].   Please call [phone] with any questions about today's appointment.  Please be sure to schedule follow up at the front  desk before you leave today. Please schedule follow up in 4 weeks.   Terisa Starr, MD  Family Medicine

## 2018-05-18 NOTE — Progress Notes (Signed)
Patient Name: [name] Date of Birth: Jan 22, 1951 Date of Visit: 05/18/18 PCP: Westley Chandler, MD  Chief Complaint: tired and drinking water a lot   Subjective: [name] is a pleasant 67 y.o. year old with history significant for hypothyroidism, hypertension and tobacco abuse presenting today for routine follow-up.  She reports she has been out of her medications for 3 weeks.  She has not taken her antihypertensives or her thyroid medication for 3 weeks.  Her previous TSH was slightly elevated and she has not changed her dose since that evaluation in early spring.  She denies headaches, nausea, blurry vision, chest pain, shortness of breath.   [name] does say she feels bloated and tired as she has not been on her thyroid medication.  She can feel when she has not had her medication.  She denies constipation, dry skin, or orthostatic symptoms.   [name] does report that over the last 3 months she has noticed increased thirst.  She is drinking water more frequently.  She is urinating more frequently.  She denies nausea or vomiting.  [name] is down to 1 to 2 cigarettes/day.  She is very pleased about this.  ROS:  ROS  Negative except for as above   I have reviewed the patient's medical, surgical, family, and social history as appropriate.   Vitals:   05/18/18 1042  BP: (!) 156/88  Pulse: 69  Temp: 98.6 F (37 C)  SpO2: 97%   Filed Weights   05/18/18 1042  Weight: 243 lb 6.4 oz (110.4 kg)   HEENT: Sclera injected mildly . Dentition is moderate. Appears well hydrated. Neck: Supple, + mildly enlarged thyroid  Cardiac: Regular rate and rhythm. Normal S1/S2. No murmurs, rubs, or gallops appreciated. Lungs: Clear bilaterally to ascultation.  Abdomen: Normoactive bowel sounds. No tenderness to deep or light palpation. No rebound or guarding.  Extremities: Warm, well perfused without edema.  Skin: Warm, dry Psych: Pleasant but tired appearing   [name]  was seen today for medication refill.  Diagnoses and all orders for this visit:  Polydipsia possibly due to diabetes will evaluate.  Could also be due to heat -     POCT A1C  Essential hypertension not at goal today as she has not been taking her blood pressure medication -     amLODipine (NORVASC) 10 MG tablet; Take 1 tablet (10 mg total) by mouth daily. -     aspirin (ASPIRIN EC) 81 MG EC tablet; Take 1 tablet (81 mg total) by mouth daily. Swallow whole. -     Basic Metabolic Panel  Hypothyroidism, unspecified type -     levothyroxine (SYNTHROID, LEVOTHROID) 112 MCG tablet; Take 1 tablet (112 mcg total) by mouth daily before breakfast.  Stage 3 chronic kidney disease (HCC) ongoing problem, needs further evaluation.  We discussed the need to avoid NSAIDs including ibuprofen.  She is only been taking Tylenol.  We discussed the risk factors including hypertension.  She needs further evaluation including a renal ultrasound. -     Phosphorus -     Vitamin D, 25-hydroxy  Osteoarthritis, refill only did not address today   -     diclofenac sodium (VOLTAREN) 1 % GEL; Apply 2 g topically 4 (four) times daily.  Tobacco abuse, congratulated on reducing use. Discussed PCV 23 vaccine.   Needs PCV 23 at follow up.   HCM   We discussed the colonoscopy today.  She reports she previously did the stool test  will review her labs.  I do not see this lab in her record at this time.  She declined a flu shot today   Terisa Starr, MD  [facility] Medicine Teaching Service

## 2018-05-19 LAB — BASIC METABOLIC PANEL
BUN / CREAT RATIO: 5 — AB (ref 12–28)
BUN: 10 mg/dL (ref 8–27)
CO2: 25 mmol/L (ref 20–29)
CREATININE: 1.83 mg/dL — AB (ref 0.57–1.00)
Calcium: 9.6 mg/dL (ref 8.7–10.3)
Chloride: 105 mmol/L (ref 96–106)
GFR calc Af Amer: 33 mL/min/{1.73_m2} — ABNORMAL LOW (ref >59)
GFR calc non Af Amer: 28 mL/min/{1.73_m2} — ABNORMAL LOW (ref >59)
GLUCOSE: 78 mg/dL (ref 65–99)
Potassium: 4.1 mmol/L (ref 3.5–5.2)
Sodium: 143 mmol/L (ref 134–144)

## 2018-05-19 LAB — VITAMIN D 25 HYDROXY (VIT D DEFICIENCY, FRACTURES): Vit D, 25-Hydroxy: 7.5 ng/mL — ABNORMAL LOW (ref 30.0–100.0)

## 2018-05-19 LAB — PHOSPHORUS: PHOSPHORUS: 2.2 mg/dL — AB (ref 2.5–4.5)

## 2018-05-21 ENCOUNTER — Other Ambulatory Visit: Payer: Self-pay | Admitting: Family Medicine

## 2018-05-21 ENCOUNTER — Telehealth: Payer: Self-pay | Admitting: Family Medicine

## 2018-05-21 DIAGNOSIS — F329 Major depressive disorder, single episode, unspecified: Secondary | ICD-10-CM

## 2018-05-21 DIAGNOSIS — F32A Depression, unspecified: Secondary | ICD-10-CM

## 2018-05-21 DIAGNOSIS — E559 Vitamin D deficiency, unspecified: Secondary | ICD-10-CM

## 2018-05-21 MED ORDER — VITAMIN D (ERGOCALCIFEROL) 1.25 MG (50000 UNIT) PO CAPS: 50000.0000 [IU] | ORAL_CAPSULE | ORAL | 0 refills | Status: DC

## 2018-05-21 NOTE — Telephone Encounter (Signed)
Attempted to call patient.  Nursing please call patient regarding the following:  1. Labs look about the same overall.  2. Her vitamin D level is low. I recommend 50,000 units for 8 weeks. This is taken once a week. Will repeat in 8 weeks.   We will discuss her low vitamin D more at her upcoming visit.   3. Her kidney function is about the same. Please remind her to avoid all oral NSAIDS (ibuprofen, naproxen, meloxicam, etc). Tylenol can be taken for pain.   Let me know if she has questions. Vitamin D at pharmacy.   Terisa Starr, MD  Family Medicine Teaching Service   At follow, reminder to self: CKD-> Ultrasound, urine protein  DEXA Referral to Nephrology

## 2018-05-21 NOTE — Telephone Encounter (Signed)
Attempted to call pt, said call couldn't be accepted at this time. Will try again tomorrow. Deseree Bruna Potter, CMA

## 2018-05-23 ENCOUNTER — Other Ambulatory Visit: Payer: Self-pay | Admitting: Family Medicine

## 2018-05-23 DIAGNOSIS — F32A Depression, unspecified: Secondary | ICD-10-CM

## 2018-05-23 DIAGNOSIS — F329 Major depressive disorder, single episode, unspecified: Secondary | ICD-10-CM

## 2018-05-24 ENCOUNTER — Other Ambulatory Visit: Payer: Self-pay | Admitting: Family Medicine

## 2018-05-24 DIAGNOSIS — F32A Depression, unspecified: Secondary | ICD-10-CM

## 2018-05-24 DIAGNOSIS — F329 Major depressive disorder, single episode, unspecified: Secondary | ICD-10-CM

## 2018-05-30 NOTE — Telephone Encounter (Signed)
Attempted pt again and it said call cant be completed at this time. Maybe you can send it in a letter and I can mail it? Deseree Bruna PotterBlount, CMA

## 2018-06-01 ENCOUNTER — Encounter: Payer: Self-pay | Admitting: Family Medicine

## 2018-06-01 NOTE — Telephone Encounter (Signed)
Letter sent.

## 2018-06-18 ENCOUNTER — Ambulatory Visit (INDEPENDENT_AMBULATORY_CARE_PROVIDER_SITE_OTHER): Payer: Medicare HMO | Admitting: Family Medicine

## 2018-06-18 ENCOUNTER — Other Ambulatory Visit: Payer: Self-pay

## 2018-06-18 ENCOUNTER — Encounter: Payer: Self-pay | Admitting: Family Medicine

## 2018-06-18 VITALS — BP 168/98 | HR 81 | Temp 98.2°F | Ht 64.0 in | Wt 235.4 lb

## 2018-06-18 DIAGNOSIS — E039 Hypothyroidism, unspecified: Secondary | ICD-10-CM | POA: Diagnosis not present

## 2018-06-18 DIAGNOSIS — N183 Chronic kidney disease, stage 3 unspecified: Secondary | ICD-10-CM

## 2018-06-18 DIAGNOSIS — I10 Essential (primary) hypertension: Secondary | ICD-10-CM

## 2018-06-18 NOTE — Assessment & Plan Note (Signed)
Discussed diagnosis at length.  The patient has had prior imaging.  Referral placed to nephrology.  The patient is currently on Norvasc, may benefit from an ACE inhibitor in the future.  Needs a urine protein to creatinine ratio at follow-up.  Vitamin D supplementation has already been initiated.

## 2018-06-18 NOTE — Progress Notes (Signed)
  Patient Name: [name] Date of Birth: November 08, 1950 Date of Visit: 06/18/18 PCP: Westley Chandler, MD  Chief Complaint: Follow-up for hypertension  Subjective: [name] is a pleasant 67 y.o. year old woman with a history of hypertension and hypothyroidism presenting today for follow-up.   The patient had an eye doctor's appointment earlier today.  Her eyes are dilated for her visit today and she reports her vision has been fully evaluated.  Due to her eye doctor's appointment the patient did not take her antihypertensive this morning.  She has an automated cuff at home.  She reports her readings at home range from 1 40-1 50 at the highest.  Her diastolic is typically under 80.  She denies signs or symptoms of low blood pressure including dizziness upon standing.  She denies headaches, chest pain, vision changes.  The patient is taking her thyroid medication as prescribed.  She reports no difficulties with this.   ROS:  ROS Negative for chest pain, headache, dyspnea on exertion.  I have reviewed the patient's medical, surgical, family, and social history as appropriate.  Repeat blood pressure obtained of 145/92 Vitals:   06/18/18 1148  BP: (!) 168/98  Pulse: 81  Temp: 98.2 F (36.8 C)  SpO2: 98%   Filed Weights   06/18/18 1148  Weight: 235 lb 6.4 oz (106.8 kg)  HEENT: Sclera anicteric.  Pupils are enlarged but reactive to light Neck: Supple Cardiac: Regular rate and rhythm. Normal S1/S2. No murmurs, rubs, or gallops appreciated. Lungs: Clear bilaterally to ascultation.  Abdomen: Normoactive bowel sounds. No tenderness to deep or light palpation. No rebound or guarding.  Extremities: Warm, well perfused without edema.  Skin: Warm, dry Psych: Pleasant and appropriate   [name] was seen today for follow-up.  Diagnoses and all orders for this visit:  Chronic kidney disease (CKD), stage III (moderate) (HCC) discussed this diagnosis at length.  She is a long-standing  history, nearly a decade of kidney dysfunction. -     Ambulatory referral to Nephrology  Acquired hypothyroidism -     TSH  Hypertension discussed importance of adherence to medications.  Patient is to bring in a log of blood pressures at upcoming visit.  Discussed low-salt diet given her history of CKD.  HCM  The patient declined both a flu shot and a pneumonia vaccine today. Discussed benefits of these vaccines. The patient declined a DEXA scan at this time we discussed the potential benefits of this intervention. We discussed the recommendation for colonoscopy.  The patient declined.  Discussed benefits of the screening exam for the prevention of colorectal cancer and related colorectal cancer mortality.   Terisa Starr, MD  Family Medicine Teaching Service

## 2018-06-18 NOTE — Assessment & Plan Note (Signed)
Repeat TSH today.  She denies signs or symptoms of thyroid deficiency or excess.

## 2018-06-18 NOTE — Patient Instructions (Signed)
It was wonderful to see you today.  Thank you for choosing [facility].   Please call [phone] with any questions about today's appointment.  Please be sure to schedule follow up at the front  desk before you leave today.   Jonell Brumbaugh, MD  Family Medicine    

## 2018-06-19 ENCOUNTER — Telehealth: Payer: Self-pay | Admitting: Family Medicine

## 2018-06-19 DIAGNOSIS — E039 Hypothyroidism, unspecified: Secondary | ICD-10-CM

## 2018-06-19 LAB — TSH: TSH: 4.18 u[IU]/mL (ref 0.450–4.500)

## 2018-06-19 MED ORDER — LEVOTHYROXINE SODIUM 112 MCG PO TABS: 112.0000 ug | ORAL_TABLET | Freq: Every day | ORAL | 3 refills | Status: DC

## 2018-06-19 NOTE — Telephone Encounter (Signed)
Attempted to call pt, said call cant be completed at this time. Will try again later. [name] Bruna Potter, CMA

## 2018-06-19 NOTE — Telephone Encounter (Signed)
Attempted to call patient.  She did not answer.  Nursing please call patient and let her know her thyroid function is normal.  She should continue to take her levothyroxine.  I have sent her a year supply of Synthroid to her pharmacy.

## 2018-06-19 NOTE — Telephone Encounter (Signed)
Attempted to call patient and there was no answer and no voice mail.  [name], CMA

## 2018-06-21 NOTE — Telephone Encounter (Signed)
Attempted once again to call patient and no answer with no voice mail set up.  Glennie Hawk, CMA

## 2018-06-25 ENCOUNTER — Encounter: Payer: Self-pay | Admitting: Family Medicine

## 2018-06-25 NOTE — Telephone Encounter (Signed)
Sent letter

## 2018-07-09 ENCOUNTER — Other Ambulatory Visit: Payer: Self-pay

## 2018-07-09 MED ORDER — ACETAMINOPHEN 500 MG PO TABS: 500.0000 mg | ORAL_TABLET | Freq: Four times a day (QID) | ORAL | 0 refills | Status: AC | PRN

## 2018-07-10 DIAGNOSIS — Z6841 Body Mass Index (BMI) 40.0 and over, adult: Secondary | ICD-10-CM | POA: Diagnosis not present

## 2018-07-10 DIAGNOSIS — N183 Chronic kidney disease, stage 3 (moderate): Secondary | ICD-10-CM | POA: Diagnosis not present

## 2018-07-10 DIAGNOSIS — I129 Hypertensive chronic kidney disease with stage 1 through stage 4 chronic kidney disease, or unspecified chronic kidney disease: Secondary | ICD-10-CM | POA: Diagnosis not present

## 2018-07-21 DIAGNOSIS — H5213 Myopia, bilateral: Secondary | ICD-10-CM | POA: Diagnosis not present

## 2018-07-21 DIAGNOSIS — H524 Presbyopia: Secondary | ICD-10-CM | POA: Diagnosis not present

## 2018-07-21 DIAGNOSIS — H52209 Unspecified astigmatism, unspecified eye: Secondary | ICD-10-CM | POA: Diagnosis not present

## 2018-09-03 ENCOUNTER — Other Ambulatory Visit: Payer: Self-pay | Admitting: *Deleted

## 2018-09-03 DIAGNOSIS — F32A Depression, unspecified: Secondary | ICD-10-CM

## 2018-09-03 DIAGNOSIS — F329 Major depressive disorder, single episode, unspecified: Secondary | ICD-10-CM

## 2018-09-03 MED ORDER — VENLAFAXINE HCL ER 75 MG PO CP24: 75.0000 mg | ORAL_CAPSULE | Freq: Every day | ORAL | 0 refills | Status: DC

## 2018-09-04 ENCOUNTER — Other Ambulatory Visit: Payer: Self-pay | Admitting: Family Medicine

## 2018-09-06 NOTE — Telephone Encounter (Signed)
Please have patient come talk to PCP about Zantac which has been recalled.

## 2018-09-19 NOTE — Telephone Encounter (Signed)
LVM for pt to call the office. Burr Soffer T Daymeon Fischman, CMA  

## 2018-09-27 ENCOUNTER — Other Ambulatory Visit: Payer: Self-pay | Admitting: Family Medicine

## 2018-09-27 DIAGNOSIS — K219 Gastro-esophageal reflux disease without esophagitis: Secondary | ICD-10-CM

## 2018-09-27 MED ORDER — FAMOTIDINE 20 MG PO TABS: 20.0000 mg | ORAL_TABLET | Freq: Two times a day (BID) | ORAL | 3 refills | Status: DC

## 2018-09-27 NOTE — Telephone Encounter (Signed)
Rx for famotidine as ranitidine on recall.  Terisa Starr, MD  Family Medicine Teaching Service

## 2018-11-01 NOTE — Telephone Encounter (Signed)
LVM for pt to call the office. Murline Weigel T Byrant Valent, CMA  

## 2018-12-13 ENCOUNTER — Other Ambulatory Visit: Payer: Self-pay | Admitting: Family Medicine

## 2018-12-13 DIAGNOSIS — F329 Major depressive disorder, single episode, unspecified: Secondary | ICD-10-CM

## 2018-12-13 DIAGNOSIS — F32A Depression, unspecified: Secondary | ICD-10-CM

## 2019-02-05 ENCOUNTER — Telehealth: Payer: Self-pay

## 2019-02-05 NOTE — Telephone Encounter (Signed)
Attempted to call patient to inform her of appointment for Medicare Wellness on 02/07/2019 at 1330-1430.  No answer and no voice mail available.  Glennie Hawk, CMA

## 2019-02-07 ENCOUNTER — Ambulatory Visit (INDEPENDENT_AMBULATORY_CARE_PROVIDER_SITE_OTHER): Payer: Medicare HMO

## 2019-02-07 ENCOUNTER — Other Ambulatory Visit: Payer: Self-pay

## 2019-02-07 DIAGNOSIS — Z0000 Encounter for general adult medical examination without abnormal findings: Secondary | ICD-10-CM | POA: Diagnosis not present

## 2019-02-07 NOTE — Progress Notes (Addendum)
Subjective:   [name] is a 68 y.o. female who presents for Medicare Annual (initial) preventive examination.  The patient consented to a virtual visit.  Review of Systems: Defer to PCP.  Objective:   Vitals: There were no vitals taken for this visit.  There is no height or weight on file to calculate BMI.  Advanced Directives 02/07/2019 05/18/2018 07/10/2017 12/11/2015 05/14/2015 04/16/2015 04/09/2015  Does Patient Have a Medical Advance Directive? No No No No No No No  Would patient like information on creating a medical advance directive? Yes (MAU/Ambulatory/Procedural Areas - Information given) No - Patient declined No - Patient declined No - patient declined information No - patient declined information No - patient declined information No - patient declined information    Tobacco Social History   Tobacco Use  Smoking Status Current Every Day Smoker  . Packs/day: 0.50  . Types: Cigarettes  Smokeless Tobacco Never Used     Ready to quit: No Counseling given: Yes  Clinical Intake:  Pre-visit preparation completed: Yes  Pain : No/denies pain  How often do you need to have someone help you when you read instructions, pamphlets, or other written materials from your doctor or pharmacy?: 2 - Rarely What is the last grade level you completed in school?: High School   Interpreter Needed?: No   Past Medical History:  Diagnosis Date  . Arthritis    hands and knees  . Cataract    both eyes  . Chronic kidney disease   . Depression    Controlled with medication- patient takes effexor   . Hyperlipidemia   . Hypertension   . Thyroid disease    Past Surgical History:  Procedure Laterality Date  . Cataracts Bilateral   . CHOLECYSTECTOMY     Family History  Family history unknown: Yes   Social History   Socioeconomic History  . Marital status: Widowed    Spouse name: Not on file  . Number of children: 5  . Years of education: 67  . Highest education level:  High school graduate  Occupational History  . 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 Financial resource strain: Not very hard  . Food insecurity:    Worry: Never true    Inability: Never true  . Transportation needs:    Medical: No    Non-medical: No  Tobacco Use  . Smoking status: Current Every Day Smoker    Packs/day: 0.50    Types: Cigarettes  . Smokeless tobacco: Never Used  Substance and Sexual Activity  . Alcohol use: Yes    Alcohol/week: 2.0 standard drinks    Types: 2 Standard drinks or equivalent per week    Comment: occassional   . Drug use: No  . Sexual activity: Not Currently    Birth control/protection: Post-menopausal  Lifestyle  . Physical activity:    Days per week: 3 days    Minutes per session: 60 min  . Stress: Not at all  Relationships  . Social connections:    Talks on phone: Three times a week    Gets together: Three times a week    Attends religious service: More than 4 times per year    Active member of club or organization: No    Attends meetings of clubs or organizations: Never    Relationship status: Widowed  Other Topics Concern  . Not on file  Social History Narrative   Patient  lives in her home with her daughter and her two grandchildren, along with her step daughter who has special needs. Patient stated she doesn't know much of anything about her family history. Patient and her siblings were "scattered" around and not with birth parents. Patient has a close relationship with her brother. He comes to see her on a regular basis and brings her anything she may need.     Outpatient Encounter Medications as of 02/07/2019  Medication Sig  . acetaminophen (TYLENOL) 500 MG tablet Take 1 tablet (500 mg total) by mouth every 6 (six) hours as needed.  Marland Kitchen. amLODipine (NORVASC) 10 MG tablet Take 1 tablet (10 mg total) by mouth daily.  Marland Kitchen. aspirin (ASPIRIN EC) 81 MG EC tablet Take 1 tablet (81 mg total) by mouth  daily. Swallow whole.  . diclofenac sodium (VOLTAREN) 1 % GEL Apply 2 g topically 4 (four) times daily.  . famotidine (PEPCID) 20 MG tablet Take 1 tablet (20 mg total) by mouth 2 (two) times daily.  Marland Kitchen. levothyroxine (SYNTHROID, LEVOTHROID) 112 MCG tablet Take 1 tablet (112 mcg total) by mouth daily before breakfast.  . rosuvastatin (CRESTOR) 10 MG tablet Take 1 tablet (10 mg total) by mouth daily.  Marland Kitchen. triamcinolone ointment (KENALOG) 0.5 % Apply 1 application topically 2 (two) times daily.  Marland Kitchen. venlafaxine XR (EFFEXOR-XR) 75 MG 24 hr capsule Take 1 capsule by mouth once daily  . Vitamin D, Ergocalciferol, (DRISDOL) 50000 units CAPS capsule Take 1 capsule (50,000 Units total) by mouth every 7 (seven) days. (Patient not taking: Reported on 02/07/2019)   No facility-administered encounter medications on file as of 02/07/2019.     Activities of Daily Living In your present state of health, do you have any difficulty performing the following activities: 02/07/2019 02/07/2019  Hearing? - N  Vision? - Y  Difficulty concentrating or making decisions? - N  Walking or climbing stairs? Y Y  Comment - arthritis in knees  Dressing or bathing? - N  Doing errands, shopping? - N  Quarry managerreparing Food and eating ? - N  Using the Toilet? - N  In the past six months, have you accidently leaked urine? - N  Do you have problems with loss of bowel control? - N  Managing your Medications? - N  Managing your Finances? - N  Housekeeping or managing your Housekeeping? - N  Some recent data might be hidden    Patient Care Team: Westley ChandlerBrown, Carina M, MD as PCP - General (Family Medicine)    Assessment:   This is a routine wellness examination for Erin BeneJoyce.  Exercise Activities and Dietary recommendations Current Exercise Habits: Home exercise routine, Type of exercise: walking, Time (Minutes): 60, Frequency (Times/Week): 3, Weekly Exercise (Minutes/Week): 90, Intensity: Mild, Exercise limited by: orthopedic  condition(s)(arthiritic knees )  Goals    . Exercise 3x per week (30-60 min per time)     Patient currently walks 3x a week for about 60 minutes at a time. Patient stated she loves walking and would like to increase the number of walks to 4x a week. Patient hasn't been able to get out as much as she used to, due to the current Pandemic.        Fall Risk Fall Risk  02/07/2019 05/18/2018 07/10/2017 02/03/2017 12/11/2015  Falls in the past year? 0 No No No No   Is the patient's home free of loose throw rugs in walkways, pet beds, electrical cords, etc?   yes      Grab  bars in the bathroom? no      Handrails on the stairs?   yes      Adequate lighting?   yes  Patient rating of health (0-10) scale: 2 [name] stated she may not be the "healthiest" person, but she feels good mentally and physically.   Depression Screen PHQ 2/9 Scores 02/07/2019 05/18/2018 01/09/2018 07/17/2017  PHQ - 2 Score 0 0 0 0     Cognitive Function  6CIT Screen 02/07/2019  What Year? 0 points  What month? 0 points  What time? 0 points  Count back from 20 0 points  Months in reverse 0 points  Repeat phrase 2 points  Total Score 2    Immunization History  Administered Date(s) Administered  . Influenza,inj,Quad PF,6+ Mos 05/14/2015  . Pneumococcal Conjugate-13 02/03/2017  . Td 06/12/2005  . Tdap 02/03/2017    Screening Tests Health Maintenance  Topic Date Due  . DEXA SCAN  10/04/2016  . PNA vac Low Risk Adult (2 of 2 - PPSV23) 06/19/2019 (Originally 02/03/2018)  . INFLUENZA VACCINE  04/13/2019  . MAMMOGRAM  03/21/2020  . COLONOSCOPY  01/24/2021  . TETANUS/TDAP  02/04/2027  . Hepatitis C Screening  Completed    Cancer Screenings: Lung: Low Dose CT Chest recommended if Age 51-80 years, 30 pack-year currently smoking OR have quit w/in 15years. Patient doesnt qualify. Breast:  Up to date on Mammogram? Yes   Up to date of Bone Density/Dexa? No Colorectal: Overdue, patient was due 3-5 years after screening exam  in 2012, per op note.   Additional Screenings: Hepatitis C Screening: Completed      Plan:  I will mail you your health and wellness goals, along with an advanced directive packet. Please look over the advanced directive packet and bring it with you to your next office visit once completed.   I have personally reviewed and noted the following in the patient's chart:   . Medical and social history . Use of alcohol, tobacco or illicit drugs  . Current medications and supplements . Functional ability and status . Nutritional status . Physical activity . Advanced directives . List of other physicians . Hospitalizations, surgeries, and ER visits in previous 12 months . Vitals . Screenings to include cognitive, depression, and falls . Referrals and appointments  In addition, I have reviewed and discussed with patient certain preventive protocols, quality metrics, and best practice recommendations. A written personalized care plan for preventive services as well as general preventive health recommendations were provided to patient.    This visit was conducted virtually in the setting of the COVID19 pandemic.    Page Lorin Picket, CMA  Westley Chandler, MD  02/08/2019   I have reviewed this visit and agree with the documentation.

## 2019-02-08 NOTE — Addendum Note (Signed)
Addended by: Steva Colder on: 02/08/2019 04:52 PM   Modules accepted: Level of Service

## 2019-02-08 NOTE — Patient Instructions (Addendum)
Thank you for talking with me today about your health and wellness goals!   Please call the office once social distancing is over to schedule with your PCP.  You spoke to Dorna Bloom, [place] over the phone for your annual wellness visit.  We discussed goals:  Goals    . Exercise 3x per week (30-60 min per time)     Patient currently walks 3x a week for about 60 minutes at a time. Patient stated she loves walking and would like to increase the number of walks to 4x a week. Patient hasn't been able to get out as much as she used to, due to the current Pandemic.        We also discussed recommended health maintenance. As discussed, you are due for a dexa scan and your follow-up colonoscopy.  Health Maintenance  Topic Date Due  . DEXA SCAN  10/04/2016  . PNA vac Low Risk Adult (2 of 2 - PPSV23) 06/19/2019 (Originally 02/03/2018)  . INFLUENZA VACCINE  04/13/2019  . MAMMOGRAM  03/21/2020  . COLONOSCOPY  01/24/2021  . TETANUS/TDAP  02/04/2027  . Hepatitis C Screening  Completed   We also discussed an advanced directive. Please look over the packet and once completed bring with you to your next office visit.   Smoking cessation instruction/counseling given: mailed to the patient  When you are ready to quit smoking: Call 1800-QUIT-NOW for help with stopping smoking.  Our clinic's number is [phone]. Please call with questions or concerns about what we discussed today.  Health Maintenance for Postmenopausal Women Menopause is a normal process in which your reproductive ability comes to an end. This process happens gradually over a span of months to years, usually between the ages of 39 and 32. Menopause is complete when you have missed 12 consecutive menstrual periods. It is important to talk with your health care provider about some of the most common conditions that affect postmenopausal women, such as heart disease, cancer, and bone loss (osteoporosis). Adopting a healthy lifestyle  and getting preventive care can help to promote your health and wellness. Those actions can also lower your chances of developing some of these common conditions. What should I know about menopause? During menopause, you may experience a number of symptoms, such as:  Moderate-to-severe hot flashes.  Night sweats.  Decrease in sex drive.  Mood swings.  Headaches.  Tiredness.  Irritability.  Memory problems.  Insomnia. Choosing to treat or not to treat menopausal changes is an individual decision that you make with your health care provider. What should I know about hormone replacement therapy and supplements? Hormone therapy products are effective for treating symptoms that are associated with menopause, such as hot flashes and night sweats. Hormone replacement carries certain risks, especially as you become older. If you are thinking about using estrogen or estrogen with progestin treatments, discuss the benefits and risks with your health care provider. What should I know about heart disease and stroke? Heart disease, heart attack, and stroke become more likely as you age. This may be due, in part, to the hormonal changes that your body experiences during menopause. These can affect how your body processes dietary fats, triglycerides, and cholesterol. Heart attack and stroke are both medical emergencies. There are many things that you can do to help prevent heart disease and stroke:  Have your blood pressure checked at least every 1-2 years. High blood pressure causes heart disease and increases the risk of stroke.  If you are  16-42 years old, ask your health care provider if you should take aspirin to prevent a heart attack or a stroke.  Do not use any tobacco products, including cigarettes, chewing tobacco, or electronic cigarettes. If you need help quitting, ask your health care provider.  It is important to eat a healthy diet and maintain a healthy weight. ? Be sure to include  plenty of vegetables, fruits, low-fat dairy products, and lean protein. ? Avoid eating foods that are high in solid fats, added sugars, or salt (sodium).  Get regular exercise. This is one of the most important things that you can do for your health. ? Try to exercise for at least 150 minutes each week. The type of exercise that you do should increase your heart rate and make you sweat. This is known as moderate-intensity exercise. ? Try to do strengthening exercises at least twice each week. Do these in addition to the moderate-intensity exercise.  Know your numbers.Ask your health care provider to check your cholesterol and your blood glucose. Continue to have your blood tested as directed by your health care provider.  What should I know about cancer screening? There are several types of cancer. Take the following steps to reduce your risk and to catch any cancer development as early as possible. Breast Cancer  Practice breast self-awareness. ? This means understanding how your breasts normally appear and feel. ? It also means doing regular breast self-exams. Let your health care provider know about any changes, no matter how small.  If you are 46 or older, have a clinician do a breast exam (clinical breast exam or CBE) every year. Depending on your age, family history, and medical history, it may be recommended that you also have a yearly breast X-ray (mammogram).  If you have a family history of breast cancer, talk with your health care provider about genetic screening.  If you are at high risk for breast cancer, talk with your health care provider about having an MRI and a mammogram every year.  Breast cancer (BRCA) gene test is recommended for women who have family members with BRCA-related cancers. Results of the assessment will determine the need for genetic counseling and BRCA1 and for BRCA2 testing. BRCA-related cancers include these types: ? Breast. This occurs in males or  females. ? Ovarian. ? Tubal. This may also be called fallopian tube cancer. ? Cancer of the abdominal or pelvic lining (peritoneal cancer). ? Prostate. ? Pancreatic. Cervical, Uterine, and Ovarian Cancer Your health care provider may recommend that you be screened regularly for cancer of the pelvic organs. These include your ovaries, uterus, and vagina. This screening involves a pelvic exam, which includes checking for microscopic changes to the surface of your cervix (Pap test).  For women ages 21-65, health care providers may recommend a pelvic exam and a Pap test every three years. For women ages 61-65, they may recommend the Pap test and pelvic exam, combined with testing for human papilloma virus (HPV), every five years. Some types of HPV increase your risk of cervical cancer. Testing for HPV may also be done on women of any age who have unclear Pap test results.  Other health care providers may not recommend any screening for nonpregnant women who are considered low risk for pelvic cancer and have no symptoms. Ask your health care provider if a screening pelvic exam is right for you.  If you have had past treatment for cervical cancer or a condition that could lead to cancer, you  need Pap tests and screening for cancer for at least 20 years after your treatment. If Pap tests have been discontinued for you, your risk factors (such as having a new sexual partner) need to be reassessed to determine if you should start having screenings again. Some women have medical problems that increase the chance of getting cervical cancer. In these cases, your health care provider may recommend that you have screening and Pap tests more often.  If you have a family history of uterine cancer or ovarian cancer, talk with your health care provider about genetic screening.  If you have vaginal bleeding after reaching menopause, tell your health care provider.  There are currently no reliable tests available to  screen for ovarian cancer. Lung Cancer Lung cancer screening is recommended for adults 18-29 years old who are at high risk for lung cancer because of a history of smoking. A yearly low-dose CT scan of the lungs is recommended if you:  Currently smoke.  Have a history of at least 30 pack-years of smoking and you currently smoke or have quit within the past 15 years. A pack-year is smoking an average of one pack of cigarettes per day for one year. Yearly screening should:  Continue until it has been 15 years since you quit.  Stop if you develop a health problem that would prevent you from having lung cancer treatment. Colorectal Cancer  This type of cancer can be detected and can often be prevented.  Routine colorectal cancer screening usually begins at age 46 and continues through age 56.  If you have risk factors for colon cancer, your health care provider may recommend that you be screened at an earlier age.  If you have a family history of colorectal cancer, talk with your health care provider about genetic screening.  Your health care provider may also recommend using home test kits to check for hidden blood in your stool.  A small camera at the end of a tube can be used to examine your colon directly (sigmoidoscopy or colonoscopy). This is done to check for the earliest forms of colorectal cancer.  Direct examination of the colon should be repeated every 5-10 years until age 105. However, if early forms of precancerous polyps or small growths are found or if you have a family history or genetic risk for colorectal cancer, you may need to be screened more often. Skin Cancer  Check your skin from head to toe regularly.  Monitor any moles. Be sure to tell your health care provider: ? About any new moles or changes in moles, especially if there is a change in a mole's shape or color. ? If you have a mole that is larger than the size of a pencil eraser.  If any of your family  members has a history of skin cancer, especially at a young age, talk with your health care provider about genetic screening.  Always use sunscreen. Apply sunscreen liberally and repeatedly throughout the day.  Whenever you are outside, protect yourself by wearing long sleeves, pants, a wide-brimmed hat, and sunglasses. What should I know about osteoporosis? Osteoporosis is a condition in which bone destruction happens more quickly than new bone creation. After menopause, you may be at an increased risk for osteoporosis. To help prevent osteoporosis or the bone fractures that can happen because of osteoporosis, the following is recommended:  If you are 30-73 years old, get at least 1,000 mg of calcium and at least 600 mg of vitamin D  per day.  If you are older than age 68 but younger than age 9, get at least 1,200 mg of calcium and at least 600 mg of vitamin D per day.  If you are older than age 32, get at least 1,200 mg of calcium and at least 800 mg of vitamin D per day. Smoking and excessive alcohol intake increase the risk of osteoporosis. Eat foods that are rich in calcium and vitamin D, and do weight-bearing exercises several times each week as directed by your health care provider. What should I know about how menopause affects my mental health? Depression may occur at any age, but it is more common as you become older. Common symptoms of depression include:  Low or sad mood.  Changes in sleep patterns.  Changes in appetite or eating patterns.  Feeling an overall lack of motivation or enjoyment of activities that you previously enjoyed.  Frequent crying spells. Talk with your health care provider if you think that you are experiencing depression. What should I know about immunizations? It is important that you get and maintain your immunizations. These include:  Tetanus, diphtheria, and pertussis (Tdap) booster vaccine.  Influenza every year before the flu season begins.   Pneumonia vaccine.  Shingles vaccine. Your health care provider may also recommend other immunizations. This information is not intended to replace advice given to you by your health care provider. Make sure you discuss any questions you have with your health care provider. Document Released: 10/21/2005 Document Revised: 03/18/2016 Document Reviewed: 06/02/2015 Elsevier Interactive Patient Education  2019 [employer].

## 2019-02-11 ENCOUNTER — Ambulatory Visit: Payer: Medicare HMO

## 2019-02-11 ENCOUNTER — Ambulatory Visit: Payer: Medicare HMO | Admitting: Family Medicine

## 2019-03-21 ENCOUNTER — Telehealth: Payer: Self-pay

## 2019-03-21 NOTE — Telephone Encounter (Signed)
Pt LVM on nurse line. Pt states she feels like her BP is going up and asked what she should do. I called pt and scheduled her an appt for tomorrow to see Dr. Kris Mouton. Ottis Stain, CMA

## 2019-03-22 ENCOUNTER — Other Ambulatory Visit: Payer: Self-pay

## 2019-03-22 ENCOUNTER — Telehealth: Payer: Self-pay | Admitting: Family Medicine

## 2019-03-22 ENCOUNTER — Ambulatory Visit (INDEPENDENT_AMBULATORY_CARE_PROVIDER_SITE_OTHER): Payer: Medicare HMO | Admitting: Family Medicine

## 2019-03-22 VITALS — BP 160/80 | HR 86

## 2019-03-22 DIAGNOSIS — R7303 Prediabetes: Secondary | ICD-10-CM

## 2019-03-22 DIAGNOSIS — F339 Major depressive disorder, recurrent, unspecified: Secondary | ICD-10-CM

## 2019-03-22 DIAGNOSIS — I10 Essential (primary) hypertension: Secondary | ICD-10-CM

## 2019-03-22 DIAGNOSIS — F4321 Adjustment disorder with depressed mood: Secondary | ICD-10-CM | POA: Diagnosis not present

## 2019-03-22 LAB — POCT GLYCOSYLATED HEMOGLOBIN (HGB A1C): Hemoglobin A1C: 6.2 % — AB (ref 4.0–5.6)

## 2019-03-22 MED ORDER — LISINOPRIL 10 MG PO TABS: 10.0000 mg | ORAL_TABLET | Freq: Every day | ORAL | 0 refills | Status: DC

## 2019-03-22 NOTE — Progress Notes (Signed)
a1c

## 2019-03-22 NOTE — Patient Instructions (Signed)
It was great meeting you today!  I am sorry about all of your losses, and recent emotional trauma.  Says like you have been dealing with all these in very healthy way.  I demolition know about our behavioral health expert here in the clinic, if you feel overwhelmed or like to talk to somebody please let us know and I will get this arranged for you.  I am sure that your blood pressure is likely elevated due to this.  I would like to get an A1c, to check your diabetes status.  This will influence the second medication I will add on for you.  I will give you a call this afternoon with those results and let you know which medication I sent into the pharmacy.
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
## 2019-03-22 NOTE — Assessment & Plan Note (Signed)
A1c 6.2.  Will start lisinopril for renal protection.  Recheck A1c in 3 months.  Patient would likely benefit from metformin, but she opted to only start the blood pressure medication at this appointment.  Can reevaluate in 3 months.

## 2019-03-22 NOTE — Assessment & Plan Note (Signed)
Very well managed at this point.  Despite all of the patient's recent emotional trauma she is doing very well and managing quite well.  She has developed several healthy hobbies to help with these feelings of grief.  Continue venlafaxine 75 mg.  Offered patient counseling with behavioral medicine which she declined at this appointment.
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
## 2019-03-22 NOTE — Telephone Encounter (Signed)
Discussed A1c result with patient.  Will send in lisinopril 10 mg to pharmacy.  We will give her a one-month supply.  Would like for her to follow-up at that time for recheck.  Also need A1c in 3 months.  Patient voiced understanding and is in agreement.  Full clinic note to follow  Erin Dawn MD PGY-3 Family Medicine Resident

## 2019-03-22 NOTE — Assessment & Plan Note (Signed)
Patient 160/80 in office.  Has been running higher than that per her report and symptomatology.  Discussed options with patient.  We will continue amlodipine 10 mg.  As she is prediabetic will add on lisinopril 10 mg daily for renal protection.

## 2019-03-23 ENCOUNTER — Other Ambulatory Visit: Payer: Self-pay | Admitting: Family Medicine

## 2019-03-23 DIAGNOSIS — F32A Depression, unspecified: Secondary | ICD-10-CM

## 2019-03-23 DIAGNOSIS — F329 Major depressive disorder, single episode, unspecified: Secondary | ICD-10-CM

## 2019-03-25 ENCOUNTER — Other Ambulatory Visit: Payer: Self-pay | Admitting: *Deleted

## 2019-03-25 ENCOUNTER — Telehealth: Payer: Self-pay | Admitting: Family Medicine

## 2019-03-25 DIAGNOSIS — F329 Major depressive disorder, single episode, unspecified: Secondary | ICD-10-CM

## 2019-03-25 DIAGNOSIS — F32A Depression, unspecified: Secondary | ICD-10-CM

## 2019-03-25 DIAGNOSIS — I10 Essential (primary) hypertension: Secondary | ICD-10-CM

## 2019-03-25 NOTE — Telephone Encounter (Signed)
Attempted to call patient, needs follow up BMP in 2 weeks. Last creatinine 1.8. Nursing- please call patient and help patient schedule lab visit.   Dorris Singh, MD  Family Medicine Teaching Service

## 2019-03-25 NOTE — Telephone Encounter (Signed)
Pt returned call.  Informed of the below and appt made on 04/08/19. Christen Bame, CMA

## 2019-04-08 ENCOUNTER — Other Ambulatory Visit: Payer: Medicare HMO

## 2019-04-16 ENCOUNTER — Other Ambulatory Visit: Payer: Self-pay

## 2019-04-16 MED ORDER — TRIAMCINOLONE ACETONIDE 0.5 % EX OINT: 1.0000 "application " | TOPICAL_OINTMENT | Freq: Two times a day (BID) | CUTANEOUS | 1 refills | Status: DC

## 2019-04-22 ENCOUNTER — Other Ambulatory Visit: Payer: Medicare HMO

## 2019-04-22 ENCOUNTER — Other Ambulatory Visit: Payer: Self-pay

## 2019-04-22 DIAGNOSIS — I10 Essential (primary) hypertension: Secondary | ICD-10-CM | POA: Diagnosis not present

## 2019-04-23 LAB — BASIC METABOLIC PANEL
BUN/Creatinine Ratio: 6 — ABNORMAL LOW (ref 12–28)
BUN: 10 mg/dL (ref 8–27)
CO2: 23 mmol/L (ref 20–29)
Calcium: 9.9 mg/dL (ref 8.7–10.3)
Chloride: 106 mmol/L (ref 96–106)
Creatinine, Ser: 1.65 mg/dL — ABNORMAL HIGH (ref 0.57–1.00)
GFR calc Af Amer: 37 mL/min/{1.73_m2} — ABNORMAL LOW (ref >59)
GFR calc non Af Amer: 32 mL/min/{1.73_m2} — ABNORMAL LOW (ref >59)
Glucose: 77 mg/dL (ref 65–99)
Potassium: 4 mmol/L (ref 3.5–5.2)
Sodium: 141 mmol/L (ref 134–144)

## 2019-04-24 ENCOUNTER — Other Ambulatory Visit: Payer: Self-pay | Admitting: Family Medicine

## 2019-05-06 ENCOUNTER — Telehealth: Payer: Self-pay | Admitting: Family Medicine

## 2019-05-06 NOTE — Telephone Encounter (Signed)
Please call patient and help her to schedule follow up for hypertension and kidney function check with me in October.   Thanks- Dorris Singh, MD  Family Medicine Teaching Service

## 2019-06-17 ENCOUNTER — Other Ambulatory Visit: Payer: Self-pay

## 2019-06-17 ENCOUNTER — Encounter: Payer: Self-pay | Admitting: Family Medicine

## 2019-06-17 ENCOUNTER — Ambulatory Visit (INDEPENDENT_AMBULATORY_CARE_PROVIDER_SITE_OTHER): Payer: Medicare HMO | Admitting: Family Medicine

## 2019-06-17 VITALS — BP 150/82 | HR 90 | Wt 227.4 lb

## 2019-06-17 DIAGNOSIS — E039 Hypothyroidism, unspecified: Secondary | ICD-10-CM

## 2019-06-17 DIAGNOSIS — F17200 Nicotine dependence, unspecified, uncomplicated: Secondary | ICD-10-CM

## 2019-06-17 DIAGNOSIS — I10 Essential (primary) hypertension: Secondary | ICD-10-CM | POA: Diagnosis not present

## 2019-06-17 DIAGNOSIS — Z23 Encounter for immunization: Secondary | ICD-10-CM | POA: Diagnosis not present

## 2019-06-17 DIAGNOSIS — R634 Abnormal weight loss: Secondary | ICD-10-CM | POA: Insufficient documentation

## 2019-06-17 DIAGNOSIS — F339 Major depressive disorder, recurrent, unspecified: Secondary | ICD-10-CM

## 2019-06-17 MED ORDER — LOSARTAN POTASSIUM 25 MG PO TABS: 25.0000 mg | ORAL_TABLET | Freq: Every day | ORAL | 3 refills | Status: DC

## 2019-06-17 NOTE — Patient Instructions (Addendum)
It was wonderful to see you today.  Thank you for choosing [facility].   Please call [phone] with any questions about today's appointment.  Please be sure to schedule follow up at the front  desk before you leave today.   Dorris Singh, MD  Family Medicine '  STOP Lisinopril--this is the new medication   START losartan      Tobacco use is damaging to your body. It increases your risk of stroke, heart attack, lung cancer, and serious lung disease in the future. It also reduces your fertility.   Quitting tobacco is the best thing for your health but is a challenge---nicotine, a chemical in cigarettes, is highly addictive.   You can call 1 800 QUIT NOW (1-8576053492)---you will be connected with a Artist. They can also mail you nicotine gums, lozenges, and patches to quit.   Ask me about patches (which you wear all day) and gums (which you use when you have a craving) to help you quit.   There are safe, effective medications to help you quit--  Varencline---also called Chantix---- is the most common medication used to help people stop smoking. It starts a low dose and is increased. I recommended choosing a quit date then starting the medication 8 days before this. Side effects include mild headache, difficulty sleeping, and odd dreams. The medication is typically very well tolerated.     Bupropion---also called Zyban---- is started 1 week before your quit date. You take 1 pill for three days then increase to 1 pill twice per day. Side effects include a mild headache and anxiety---this usually goes away. Some patients experience weight loss.

## 2019-06-17 NOTE — Assessment & Plan Note (Signed)
TSH repeated today.

## 2019-06-17 NOTE — Assessment & Plan Note (Signed)
Repeat metabolic panel today.  Transition to losartan at the end of the week after she is off lisinopril for 5 days.

## 2019-06-17 NOTE — Assessment & Plan Note (Signed)
Patient continues to be pre-contemplative about quitting smoking.  She reports her family is her major stressor at this time.  Declines medication at this time.

## 2019-06-17 NOTE — Assessment & Plan Note (Signed)
Total of 8 pound weight loss in 1 year. Patient reports 'no appetite'. Will monitor, follow up in 3 months, if continues, needs full evaluation. Due for mammogram, may quality for low dose CT. UTD on colonoscopy.

## 2019-06-17 NOTE — Progress Notes (Addendum)
  Patient Name: [name] Date of Birth: 1951/07/24 Date of Visit: 06/17/19 PCP: Martyn Malay, MD  Chief Complaint: blood pressure   Subjective: [name] is a pleasant 68 y.o. with medical history significant for hypertension, tobacco abuse and anxiety presenting today for check-in and several concerns.  The patient reports overall she is doing well.  Her daughter is living with her currently her daughter has significant difficulty with anxiety and has been spending time with the patient.  [name] is worried that she may eventually get custody of her 2 great grandchildren who are ages 77 and 2 currently.  She does not really wish to this visit does not think there is a great option for the kids otherwise.  Patient reports a 8 pound weight loss. She has not noticed this. She reports overall reducing her intake due to 'stress'. No dyspnea, chest pain, fevers, night sweats, hematochezia, or melena.   The patient reports she has developed a dry cough since starting lisinopril.  This happens mostly at night.  She denies hemoptysis.  She would like to trial another medication.  She has no associated nasal congestion.  She has tried water for her symptoms with somewhat helps but the cough is quite bothersome at night.  No chest pain or dyspnea she continues to smoke.  She is not interested in quitting smoking at this time.  The patient is taking her Synthroid as prescribed.  She reports no signs or symptoms of excess or deficiency of thyroid.  ROS: Per HPI.   I have reviewed the patient's medical, surgical, family, and social history as appropriate.  Vitals:   06/17/19 1137  BP: (!) 150/82  Pulse: 90  SpO2: 92%   HEENT: Sclera anicteric. Dentition is poor. Appears well hydrated. Neck: Supple Cardiac: Regular rate and rhythm. Normal S1/S2. No murmurs, rubs, or gallops appreciated. Lungs: Clear bilaterally to ascultation.  Extremities: Warm, well perfused without edema.   Skin: Warm, dry Psych: Pleasant and appropriate   Essential hypertension and cough which is new likely secondary to ACE inhibitor use differential for cough is broad but most likely related to new lisinopril has she recently started this.  Counseled about tobacco abuse as below.  Less likely sinus disease. Repeat metabolic panel today.  Transition to losartan 25 mg at the end of the week after she is off lisinopril for 5 days.  TOBACCO DEPENDENCE Patient continues to be pre-contemplative about quitting smoking.  She reports her family is her major stressor at this time.  Declines medication at this time.  Hypothyroidism TSH repeated today.  Unintentional Weight loss, 8 pounds in 1 year, patient reports change in mood and thus appetite. Discussed and recommended mammogram. UTD on colonoscopy. No red flags on ROS, will monitor, follow up in 3 months.   Healthcare maintenance, the patient declined DEXA scan today.  Influenza vaccine was given today.  Return to care in 3 months to check blood pressure and close monitoring of weight.    Dorris Singh, MD  Family Medicine Teaching Service

## 2019-06-18 LAB — BASIC METABOLIC PANEL
BUN/Creatinine Ratio: 5 — ABNORMAL LOW (ref 12–28)
BUN: 8 mg/dL (ref 8–27)
CO2: 23 mmol/L (ref 20–29)
Calcium: 9.8 mg/dL (ref 8.7–10.3)
Chloride: 108 mmol/L — ABNORMAL HIGH (ref 96–106)
Creatinine, Ser: 1.57 mg/dL — ABNORMAL HIGH (ref 0.57–1.00)
GFR calc Af Amer: 39 mL/min/{1.73_m2} — ABNORMAL LOW (ref >59)
GFR calc non Af Amer: 34 mL/min/{1.73_m2} — ABNORMAL LOW (ref >59)
Glucose: 92 mg/dL (ref 65–99)
Potassium: 4.1 mmol/L (ref 3.5–5.2)
Sodium: 144 mmol/L (ref 134–144)

## 2019-06-18 LAB — TSH: TSH: 10.5 u[IU]/mL — ABNORMAL HIGH (ref 0.450–4.500)

## 2019-06-19 ENCOUNTER — Telehealth: Payer: Self-pay | Admitting: Family Medicine

## 2019-06-19 NOTE — Telephone Encounter (Signed)
Attempted to call patient again, will send letter.

## 2019-06-19 NOTE — Telephone Encounter (Signed)
Attempted to call patient.  No voicemail set up.  Patient needs to increase Synthroid to 125.  Dorris Singh, MD  Family Medicine Teaching Service

## 2019-06-20 ENCOUNTER — Telehealth: Payer: Self-pay | Admitting: Family Medicine

## 2019-06-20 DIAGNOSIS — E039 Hypothyroidism, unspecified: Secondary | ICD-10-CM

## 2019-06-20 MED ORDER — LEVOTHYROXINE SODIUM 125 MCG PO TABS: 125.0000 ug | ORAL_TABLET | ORAL | 0 refills | Status: DC

## 2019-06-20 NOTE — Telephone Encounter (Signed)
Called patient regarding labs, creatinine at baseline. Will repeat as transitioned from ACE to ARB. She has been seeing Nephrology.  Levothyroxine dose increased to 125 mcg, repeat labs in 6 weeks. Reminder sent to self.   Dorris Singh, MD  Family Medicine Teaching Service

## 2019-07-17 ENCOUNTER — Other Ambulatory Visit: Payer: Self-pay

## 2019-07-17 DIAGNOSIS — F329 Major depressive disorder, single episode, unspecified: Secondary | ICD-10-CM

## 2019-07-17 DIAGNOSIS — F32A Depression, unspecified: Secondary | ICD-10-CM

## 2019-07-17 DIAGNOSIS — I10 Essential (primary) hypertension: Secondary | ICD-10-CM

## 2019-07-17 MED ORDER — VENLAFAXINE HCL ER 75 MG PO CP24: 75.0000 mg | ORAL_CAPSULE | Freq: Every day | ORAL | 0 refills | Status: DC

## 2019-07-17 MED ORDER — AMLODIPINE BESYLATE 10 MG PO TABS: 10.0000 mg | ORAL_TABLET | Freq: Every day | ORAL | 3 refills | Status: DC

## 2019-07-19 NOTE — Telephone Encounter (Signed)
Called patient, ordered TSH for collection on Monday.   Erin Singh, MD  Family Medicine Teaching Service

## 2019-07-19 NOTE — Addendum Note (Signed)
Addended by: Owens Shark, Lesleyanne Politte on: 07/19/2019 03:38 PM   Modules accepted: Orders

## 2019-07-22 ENCOUNTER — Other Ambulatory Visit: Payer: Medicare HMO

## 2019-07-22 ENCOUNTER — Other Ambulatory Visit: Payer: Self-pay

## 2019-07-22 DIAGNOSIS — E039 Hypothyroidism, unspecified: Secondary | ICD-10-CM

## 2019-07-23 LAB — TSH: TSH: 2.51 u[IU]/mL (ref 0.450–4.500)

## 2019-07-24 ENCOUNTER — Telehealth: Payer: Self-pay | Admitting: Family Medicine

## 2019-07-24 DIAGNOSIS — E039 Hypothyroidism, unspecified: Secondary | ICD-10-CM

## 2019-07-24 MED ORDER — LEVOTHYROXINE SODIUM 125 MCG PO TABS: 125.0000 ug | ORAL_TABLET | ORAL | 3 refills | Status: DC

## 2019-07-24 NOTE — Telephone Encounter (Signed)
Called with normal TSH, refilled synthroid X1 year.   Dorris Singh, MD  Family Medicine Teaching Service

## 2019-08-19 DIAGNOSIS — N1831 Chronic kidney disease, stage 3a: Secondary | ICD-10-CM | POA: Diagnosis not present

## 2019-09-27 DIAGNOSIS — N1831 Chronic kidney disease, stage 3a: Secondary | ICD-10-CM | POA: Diagnosis not present

## 2019-09-27 DIAGNOSIS — I129 Hypertensive chronic kidney disease with stage 1 through stage 4 chronic kidney disease, or unspecified chronic kidney disease: Secondary | ICD-10-CM | POA: Diagnosis not present

## 2019-10-07 ENCOUNTER — Other Ambulatory Visit: Payer: Self-pay | Admitting: *Deleted

## 2019-10-07 DIAGNOSIS — F329 Major depressive disorder, single episode, unspecified: Secondary | ICD-10-CM

## 2019-10-07 DIAGNOSIS — F32A Depression, unspecified: Secondary | ICD-10-CM

## 2019-10-07 MED ORDER — VENLAFAXINE HCL ER 75 MG PO CP24: 75.0000 mg | ORAL_CAPSULE | Freq: Every day | ORAL | 0 refills | Status: DC

## 2019-10-22 ENCOUNTER — Telehealth: Payer: Self-pay | Admitting: *Deleted

## 2019-10-22 NOTE — Telephone Encounter (Signed)
Pharmacy needs the ok to change to euthyrox because levothyroxine brand no longer available. [name] Bruna Potter, CMA

## 2019-10-22 NOTE — Telephone Encounter (Signed)
Okay to use any brand levothyroxine.  Thank you, Terisa Starr, MD  [facility] Medicine Teaching Service

## 2019-10-23 NOTE — Telephone Encounter (Signed)
Called  Pharmacy and spoke to [place] and gave her permission per Dr. Manson Passey to switch to Euthrox.  Glennie Hawk, CMA

## 2020-01-28 ENCOUNTER — Other Ambulatory Visit: Payer: Self-pay

## 2020-01-28 DIAGNOSIS — F32A Depression, unspecified: Secondary | ICD-10-CM

## 2020-01-28 MED ORDER — VENLAFAXINE HCL ER 75 MG PO CP24: 75.0000 mg | ORAL_CAPSULE | Freq: Every day | ORAL | 0 refills | Status: DC

## 2020-01-29 ENCOUNTER — Other Ambulatory Visit: Payer: Self-pay | Admitting: *Deleted

## 2020-01-29 DIAGNOSIS — F32A Depression, unspecified: Secondary | ICD-10-CM

## 2020-01-29 NOTE — Telephone Encounter (Signed)
Attempted to call pt no answer, phone just kept ringing. Will try again later.[name] Bruna Potter, CMA

## 2020-02-24 ENCOUNTER — Other Ambulatory Visit: Payer: Self-pay

## 2020-02-24 DIAGNOSIS — F32A Depression, unspecified: Secondary | ICD-10-CM

## 2020-02-24 MED ORDER — VENLAFAXINE HCL ER 75 MG PO CP24: 75.0000 mg | ORAL_CAPSULE | Freq: Every day | ORAL | 0 refills | Status: DC

## 2020-05-14 ENCOUNTER — Other Ambulatory Visit: Payer: Self-pay

## 2020-05-14 MED ORDER — TRIAMCINOLONE ACETONIDE 0.5 % EX OINT: 1.0000 "application " | TOPICAL_OINTMENT | Freq: Two times a day (BID) | CUTANEOUS | 1 refills | Status: DC

## 2020-08-25 ENCOUNTER — Other Ambulatory Visit: Payer: Self-pay | Admitting: *Deleted

## 2020-08-25 DIAGNOSIS — E039 Hypothyroidism, unspecified: Secondary | ICD-10-CM

## 2020-08-25 DIAGNOSIS — I10 Essential (primary) hypertension: Secondary | ICD-10-CM

## 2020-08-25 DIAGNOSIS — F32A Depression, unspecified: Secondary | ICD-10-CM

## 2020-08-25 MED ORDER — LEVOTHYROXINE SODIUM 125 MCG PO TABS: 125.0000 ug | ORAL_TABLET | ORAL | 0 refills | Status: DC

## 2020-08-25 MED ORDER — AMLODIPINE BESYLATE 10 MG PO TABS: 10.0000 mg | ORAL_TABLET | Freq: Every day | ORAL | 0 refills | Status: DC

## 2020-08-25 MED ORDER — LOSARTAN POTASSIUM 25 MG PO TABS: 25.0000 mg | ORAL_TABLET | Freq: Every day | ORAL | 0 refills | Status: DC

## 2020-08-25 MED ORDER — VENLAFAXINE HCL ER 75 MG PO CP24: 75.0000 mg | ORAL_CAPSULE | Freq: Every day | ORAL | 0 refills | Status: DC

## 2020-09-09 ENCOUNTER — Telehealth: Payer: Self-pay | Admitting: *Deleted

## 2020-09-09 NOTE — Telephone Encounter (Signed)
Refilled 12/14---can you please call and see if pharmacy needs another refill?

## 2020-12-10 ENCOUNTER — Other Ambulatory Visit: Payer: Self-pay

## 2020-12-10 DIAGNOSIS — E039 Hypothyroidism, unspecified: Secondary | ICD-10-CM

## 2020-12-10 DIAGNOSIS — F32A Depression, unspecified: Secondary | ICD-10-CM

## 2020-12-10 MED ORDER — VENLAFAXINE HCL ER 75 MG PO CP24: 75.0000 mg | ORAL_CAPSULE | Freq: Every day | ORAL | 0 refills | Status: DC

## 2020-12-10 MED ORDER — LOSARTAN POTASSIUM 25 MG PO TABS
25.0000 mg | ORAL_TABLET | Freq: Every day | ORAL | 0 refills | Status: DC
Start: 2020-12-10 — End: 2021-03-23

## 2020-12-10 MED ORDER — LEVOTHYROXINE SODIUM 125 MCG PO TABS: 125.0000 ug | ORAL_TABLET | ORAL | 0 refills | Status: DC

## 2020-12-14 ENCOUNTER — Other Ambulatory Visit: Payer: Self-pay | Admitting: *Deleted

## 2020-12-14 DIAGNOSIS — I10 Essential (primary) hypertension: Secondary | ICD-10-CM

## 2020-12-14 MED ORDER — AMLODIPINE BESYLATE 10 MG PO TABS: 10.0000 mg | ORAL_TABLET | Freq: Every day | ORAL | 0 refills | Status: DC

## 2021-03-23 ENCOUNTER — Other Ambulatory Visit: Payer: Self-pay | Admitting: *Deleted

## 2021-03-23 DIAGNOSIS — E039 Hypothyroidism, unspecified: Secondary | ICD-10-CM

## 2021-03-23 DIAGNOSIS — F32A Depression, unspecified: Secondary | ICD-10-CM

## 2021-03-23 DIAGNOSIS — I10 Essential (primary) hypertension: Secondary | ICD-10-CM

## 2021-03-23 MED ORDER — VENLAFAXINE HCL ER 75 MG PO CP24: 75.0000 mg | ORAL_CAPSULE | Freq: Every day | ORAL | 0 refills | Status: DC

## 2021-03-23 MED ORDER — AMLODIPINE BESYLATE 10 MG PO TABS: 10.0000 mg | ORAL_TABLET | Freq: Every day | ORAL | 0 refills | Status: DC

## 2021-03-23 MED ORDER — LEVOTHYROXINE SODIUM 125 MCG PO TABS: 125.0000 ug | ORAL_TABLET | ORAL | 0 refills | Status: DC

## 2021-03-23 MED ORDER — LOSARTAN POTASSIUM 25 MG PO TABS: 25.0000 mg | ORAL_TABLET | Freq: Every day | ORAL | 0 refills | Status: DC

## 2021-04-29 ENCOUNTER — Other Ambulatory Visit: Payer: Self-pay | Admitting: *Deleted

## 2021-04-29 DIAGNOSIS — E039 Hypothyroidism, unspecified: Secondary | ICD-10-CM

## 2021-04-29 MED ORDER — LEVOTHYROXINE SODIUM 125 MCG PO TABS: 125.0000 ug | ORAL_TABLET | ORAL | 0 refills | Status: DC

## 2021-04-29 MED ORDER — LOSARTAN POTASSIUM 25 MG PO TABS: 25.0000 mg | ORAL_TABLET | Freq: Every day | ORAL | 0 refills | Status: DC

## 2021-04-30 ENCOUNTER — Other Ambulatory Visit: Payer: Self-pay

## 2021-04-30 DIAGNOSIS — I10 Essential (primary) hypertension: Secondary | ICD-10-CM

## 2021-04-30 MED ORDER — AMLODIPINE BESYLATE 10 MG PO TABS: 10.0000 mg | ORAL_TABLET | Freq: Every day | ORAL | 0 refills | Status: DC

## 2021-05-05 ENCOUNTER — Telehealth: Payer: Self-pay

## 2021-05-05 ENCOUNTER — Other Ambulatory Visit: Payer: Self-pay | Admitting: *Deleted

## 2021-05-05 DIAGNOSIS — E039 Hypothyroidism, unspecified: Secondary | ICD-10-CM

## 2021-05-05 DIAGNOSIS — F32A Depression, unspecified: Secondary | ICD-10-CM

## 2021-05-05 DIAGNOSIS — I10 Essential (primary) hypertension: Secondary | ICD-10-CM

## 2021-05-05 MED ORDER — AMLODIPINE BESYLATE 10 MG PO TABS: 10.0000 mg | ORAL_TABLET | Freq: Every day | ORAL | 0 refills | Status: DC

## 2021-05-05 MED ORDER — VENLAFAXINE HCL ER 75 MG PO CP24: 75.0000 mg | ORAL_CAPSULE | Freq: Every day | ORAL | 0 refills | Status: DC

## 2021-05-05 MED ORDER — LEVOTHYROXINE SODIUM 125 MCG PO TABS: 125.0000 ug | ORAL_TABLET | ORAL | 0 refills | Status: DC

## 2021-05-05 MED ORDER — LOSARTAN POTASSIUM 25 MG PO TABS: 25.0000 mg | ORAL_TABLET | Freq: Every day | ORAL | 0 refills | Status: DC

## 2021-05-05 NOTE — Telephone Encounter (Signed)
Prescriptions for pharmacy for 30 more days-please let patient know.  Terisa Starr, MD  Family Medicine Teaching Service

## 2021-05-05 NOTE — Telephone Encounter (Signed)
Patient calls nurse line regarding need for refills on the following medications:  -Losartan -Amlodipine -Synthroid  Patient has scheduled a follow up appointment on 9/16 with PCP, however, will run out of these medications before then. Please advise if additional medication can be sent to pharmacy to last patient until 9/16.  Veronda Prude, RN

## 2021-05-06 NOTE — Telephone Encounter (Signed)
Attempted to call patient and there was no answer and no ability to leave a voice mail message.  Glennie Hawk, CMA

## 2021-05-07 NOTE — Telephone Encounter (Signed)
Attempted to call patient once again to no avail.  If patient calls, please advise her of Dr. Theora Gianotti note.  Glennie Hawk, CMA

## 2021-05-28 ENCOUNTER — Ambulatory Visit (INDEPENDENT_AMBULATORY_CARE_PROVIDER_SITE_OTHER): Payer: Medicare (Managed Care) | Admitting: Family Medicine

## 2021-05-28 ENCOUNTER — Encounter: Payer: Self-pay | Admitting: Family Medicine

## 2021-05-28 ENCOUNTER — Other Ambulatory Visit: Payer: Self-pay

## 2021-05-28 VITALS — BP 156/96 | HR 101 | Wt 231.0 lb

## 2021-05-28 DIAGNOSIS — Z659 Problem related to unspecified psychosocial circumstances: Secondary | ICD-10-CM

## 2021-05-28 DIAGNOSIS — I10 Essential (primary) hypertension: Secondary | ICD-10-CM | POA: Diagnosis not present

## 2021-05-28 DIAGNOSIS — F17209 Nicotine dependence, unspecified, with unspecified nicotine-induced disorders: Secondary | ICD-10-CM

## 2021-05-28 DIAGNOSIS — F17200 Nicotine dependence, unspecified, uncomplicated: Secondary | ICD-10-CM

## 2021-05-28 DIAGNOSIS — Z716 Tobacco abuse counseling: Secondary | ICD-10-CM

## 2021-05-28 DIAGNOSIS — E039 Hypothyroidism, unspecified: Secondary | ICD-10-CM

## 2021-05-28 DIAGNOSIS — Z1239 Encounter for other screening for malignant neoplasm of breast: Secondary | ICD-10-CM

## 2021-05-28 DIAGNOSIS — Z23 Encounter for immunization: Secondary | ICD-10-CM

## 2021-05-28 DIAGNOSIS — Z0000 Encounter for general adult medical examination without abnormal findings: Secondary | ICD-10-CM

## 2021-05-28 MED ORDER — SHINGRIX 50 MCG/0.5ML IM SUSR: 0.5000 mL | INTRAMUSCULAR | 0 refills | Status: AC

## 2021-05-28 MED ORDER — LOSARTAN POTASSIUM 25 MG PO TABS: 25.0000 mg | ORAL_TABLET | Freq: Every day | ORAL | 3 refills | Status: DC

## 2021-05-28 NOTE — Assessment & Plan Note (Signed)
Discussed cessation, pre contemplative, smoking 1/2 ppd.  Briefly discussed low dose CT, will need ongoing discussion, continue to address.

## 2021-05-28 NOTE — Assessment & Plan Note (Signed)
TSH obtained today, will refill and adjust as appropriate.

## 2021-05-28 NOTE — Assessment & Plan Note (Signed)
Above goal as has been out of losartan X1 month. BMP today. Follow up 4 weeks for BP check.

## 2021-05-28 NOTE — Patient Instructions (Addendum)
-  I am SO sorry to hear about your son and your losses.    Please bring ALL of your medications with you to every visit.   Today we talked about:  -- I sent in a refill for your blood pressure pill - I will send in refills for the rest of your medications after your blood work returns - You will be called about Medicaid Transportation   - Please call [facility] to schedule your mammogram  - You will be called about a colonoscopy    Please follow up in 2 months   Thank you for choosing [facility].   Please call [phone] with any questions about today's appointment.  Please be sure to schedule follow up at the front  desk before you leave today.   Terisa Starr, MD  Family Medicine

## 2021-05-28 NOTE — Progress Notes (Signed)
SUBJECTIVE:   CHIEF COMPLAINT: Checkup and transportation concerns HPI:   [name] is a 70 y.o. yo with history notable for hypertension, tobacco abuse, and hypothyroidism presenting for routine follow-up.  In regards the patient's hypertension she has not had a metabolic panel in about 2 years.  She reports compliance with all of her medications except losartan.  She has been off this for about a month.  She denies headaches, chest pain, dyspnea, lower extremity edema.  She feels overall well. The patient reports she does not particular want her colonoscopy and would like to hold off on this at this time.  The patient reports compliance with her levothyroxine.  The patient reports since I last saw her she had a number of stresses.  Her son-in-law and son have both passed away.  Her son actually passed away in her house and her grand child found her son.  She has her 2 great-grandchildren with her frequently.  She has support through her longtime friend named Letha Cape.  She denies thoughts of hurting herself or others.  The patient reports that her biggest stressor is actually transportation.  This is why she has not been to a visit in quite some time.  It is hard for her to use public transportation due to both cost and timing.  She also frequently has children at home.  She does not have a car.   PERTINENT  PMH / PSH/Family/Social History : Updated and reviewed as appropriate.  OBJECTIVE:   BP (!) 156/96   Pulse (!) 101   Wt 231 lb (104.8 kg)   SpO2 92%   BMI 39.65 kg/m   Today's weight:  Last Weight  Most recent update: 05/28/2021  8:28 AM    Weight  104.8 kg (231 lb)            Review of prior weights: [employer]   05/28/21 0827  Weight: 231 lb (104.8 kg)    Pleasant appearing woman in no distress.  Tympanic membranes are unremarkable.  She is edentulous neck is supple there is no thyromegaly regular rate and rhythm no murmurs rubs or gallops.  Lungs  are clear abdomen is soft nontender nondistended.  There are no masses.  Prior imaging was reviewed and her ultrasound of her thyroid did not show any specific nodules requiring biopsy.  TSH is below  ASSESSMENT/PLAN:   Hypothyroidism TSH obtained today, will refill and adjust as appropriate.   Essential hypertension Above goal as has been out of losartan X1 month. BMP today. Follow up 4 weeks for BP check.   TOBACCO DEPENDENCE Discussed cessation, pre contemplative, smoking 1/2 ppd.  Briefly discussed low dose CT, will need ongoing discussion, continue to address.   Blood pressure is not at goal.  Medications were restarted.  Tobacco abuse, discussed benefits of cessation.  She is precontemplative at this time and uses this  for stress relief.  Social stressors, namely transportation.  Patient agreed to chronic care management services for transportation.  Grief, discussed prolonged and complicated grief.  Differential includes depression.  I discussed this with her.  She is not interested in medication or therapy at this time.  She has support through her family including her great caring kids and her best friend.  Reviewed reasons to call and return to care we will continue to offer resources.  HCM Declined colonoscopy, continue to discuss.  Continue to discuss low-dose CT at next visit. She declined a pneumococcal vaccine.  Discussed  benefits.  Influenza shot was given and a prescription for shingles shot was also given.  Patient is edentulous and does not like provider to look at her mouth.    Terisa Starr, MD  Family Medicine Teaching Service  [facility]

## 2021-05-29 LAB — LIPID PANEL
Chol/HDL Ratio: 4.5 ratio — ABNORMAL HIGH (ref 0.0–4.4)
Cholesterol, Total: 210 mg/dL — ABNORMAL HIGH (ref 100–199)
HDL: 47 mg/dL (ref >39)
LDL Chol Calc (NIH): 146 mg/dL — ABNORMAL HIGH (ref 0–99)
Triglycerides: 92 mg/dL (ref 0–149)
VLDL Cholesterol Cal: 17 mg/dL (ref 5–40)

## 2021-05-29 LAB — BASIC METABOLIC PANEL
BUN/Creatinine Ratio: 5 — ABNORMAL LOW (ref 12–28)
BUN: 9 mg/dL (ref 8–27)
CO2: 24 mmol/L (ref 20–29)
Calcium: 10 mg/dL (ref 8.7–10.3)
Chloride: 106 mmol/L (ref 96–106)
Creatinine, Ser: 1.64 mg/dL — ABNORMAL HIGH (ref 0.57–1.00)
Glucose: 101 mg/dL — ABNORMAL HIGH (ref 65–99)
Potassium: 3.8 mmol/L (ref 3.5–5.2)
Sodium: 144 mmol/L (ref 134–144)
eGFR: 34 mL/min/{1.73_m2} — ABNORMAL LOW (ref >59)

## 2021-05-29 LAB — TSH: TSH: 3.11 u[IU]/mL (ref 0.450–4.500)

## 2021-05-31 ENCOUNTER — Other Ambulatory Visit: Payer: Self-pay

## 2021-05-31 DIAGNOSIS — I10 Essential (primary) hypertension: Secondary | ICD-10-CM

## 2021-05-31 DIAGNOSIS — F32A Depression, unspecified: Secondary | ICD-10-CM

## 2021-05-31 MED ORDER — VENLAFAXINE HCL ER 75 MG PO CP24: 75.0000 mg | ORAL_CAPSULE | Freq: Every day | ORAL | 3 refills | Status: DC

## 2021-05-31 MED ORDER — AMLODIPINE BESYLATE 10 MG PO TABS: 10.0000 mg | ORAL_TABLET | Freq: Every day | ORAL | 0 refills | Status: DC

## 2021-06-02 ENCOUNTER — Telehealth: Payer: Self-pay | Admitting: Family Medicine

## 2021-06-02 DIAGNOSIS — E785 Hyperlipidemia, unspecified: Secondary | ICD-10-CM

## 2021-06-02 DIAGNOSIS — E039 Hypothyroidism, unspecified: Secondary | ICD-10-CM

## 2021-06-02 MED ORDER — ROSUVASTATIN CALCIUM 10 MG PO TABS: 10.0000 mg | ORAL_TABLET | Freq: Every day | ORAL | 3 refills | Status: DC

## 2021-06-02 MED ORDER — LEVOTHYROXINE SODIUM 125 MCG PO TABS: 125.0000 ug | ORAL_TABLET | ORAL | 3 refills | Status: DC

## 2021-06-02 NOTE — Telephone Encounter (Signed)
Called patient.  She has not been taking her rosuvastatin.  Refilled medication for 1 year.  Recommended she follow-up in 4 to 6 weeks for repeat.  Reviewed all of her labs.  Her kidney disease is at baseline.  Refilled Synthroid for 1 year.  All questions were answered.

## 2021-06-09 ENCOUNTER — Telehealth: Payer: Self-pay

## 2021-06-09 NOTE — Telephone Encounter (Signed)
   Telephone encounter was:  Successful.  06/09/2021 Name: [name] MRN: [id number] DOB: 1951-03-17  [name] is a 70 y.o. year old female who is a primary care patient of Westley Chandler, MD . The community resource team was consulted for assistance with Transportation Needs   Care guide performed the following interventions: Spoke with patient about [facility] [phone], 8am-5pm.  Advised patient that is best to call them at 8am in order to reach them.  Patient stated she was able to call unassisted.  Follow Up Plan:  No further follow up planned at this time. The patient has been provided with needed resources.  Gustave Lindeman, AAS Paralegal, [facility] Care Guide  Embedded Care Coordination [place]  Care Management  [address] [place], [place] [address] [email or web address]  ?? [phone]   [email or web address]

## 2021-06-10 ENCOUNTER — Other Ambulatory Visit: Payer: Self-pay

## 2021-06-10 ENCOUNTER — Ambulatory Visit
Admission: RE | Admit: 2021-06-10 | Discharge: 2021-06-10 | Disposition: A | Discharge status: Home / Self Care | Payer: Medicare (Managed Care) | Source: Ambulatory Visit | Attending: Family Medicine | Admitting: Family Medicine

## 2021-06-10 DIAGNOSIS — Z1239 Encounter for other screening for malignant neoplasm of breast: Secondary | ICD-10-CM

## 2021-06-14 ENCOUNTER — Other Ambulatory Visit: Payer: Self-pay | Admitting: *Deleted

## 2021-06-14 DIAGNOSIS — F32A Depression, unspecified: Secondary | ICD-10-CM

## 2021-06-14 NOTE — Telephone Encounter (Signed)
Recently refilled---can we check to see if she needs this medication?   Thank you

## 2021-06-14 NOTE — Telephone Encounter (Signed)
Refill too soon--asked nursing to call to see if she needs this.   Terisa Starr, MD  Family Medicine Teaching Service

## 2021-06-16 ENCOUNTER — Encounter: Payer: Self-pay | Admitting: Family Medicine

## 2021-06-22 ENCOUNTER — Other Ambulatory Visit: Payer: Self-pay

## 2021-06-22 DIAGNOSIS — E039 Hypothyroidism, unspecified: Secondary | ICD-10-CM

## 2021-06-22 MED ORDER — LEVOTHYROXINE SODIUM 125 MCG PO TABS: 125.0000 ug | ORAL_TABLET | ORAL | 3 refills | Status: DC

## 2021-07-15 ENCOUNTER — Other Ambulatory Visit: Payer: Self-pay

## 2021-07-15 DIAGNOSIS — I10 Essential (primary) hypertension: Secondary | ICD-10-CM

## 2021-07-15 MED ORDER — AMLODIPINE BESYLATE 10 MG PO TABS: 10.0000 mg | ORAL_TABLET | Freq: Every day | ORAL | 3 refills | Status: DC

## 2021-07-28 ENCOUNTER — Other Ambulatory Visit: Payer: Self-pay

## 2021-07-28 DIAGNOSIS — E039 Hypothyroidism, unspecified: Secondary | ICD-10-CM

## 2021-07-28 MED ORDER — LEVOTHYROXINE SODIUM 125 MCG PO TABS: 125.0000 ug | ORAL_TABLET | ORAL | 3 refills | Status: DC

## 2021-09-21 ENCOUNTER — Encounter: Payer: Self-pay | Admitting: Family Medicine

## 2021-10-25 NOTE — Patient Instructions (Addendum)
It was wonderful to see you today.  Please bring ALL of your medications with you to every visit.   Today we talked about:  -Your blood pressure was high at 161/90 today but improved to 136/82 after giving you some time to rest. Our goal is <130/80.  -Medication adjustments: we are increasing your Losartan to 50 mg, please return in 2 weeks for blood work and blood pressure recheck. -We are collecting lab work today to assess your kidney function, electrolytes, . I will let you know the results via telephone.   Thank you for choosing [facility].   Please call [phone] with any questions about today's appointment.  Please be sure to schedule follow up at the front  desk before you leave today.   Sabino Dick, DO PGY-2 Family Medicine

## 2021-10-25 NOTE — Progress Notes (Signed)
SUBJECTIVE:   CHIEF COMPLAINT / HPI:   Elevated Blood Pressure, Lower Extremity Edema [name] is a 71 y.o. female who presents to the clinic today due to concerns regarding elevated blood pressures and swelling of lower extremities. She was last seen in the [facility] clinic by her PCP on 05/28/21. At that time, her BP was elevated at 156/96 and she had been out of her Losartan for about a month. This was refilled and patient was recommended to return in 4 weeks for BP follow up.   Per chart review, patient has difficult social situation and difficulties with transportation which makes attending appointments difficult. Current medications include Losartan 25 mg. Reports good compliance.  Patient brought her most recent blood pressure log with her, readings are noted below.  2/10: 169/108 2/11: 162/96 2/12: 163/99 2/13: 145/90  Denies chest pain, shortness of breath.  She does feel like her legs are swollen.  She does endorse a frontal headache that she believes is related to her elevated blood pressure.  She has been using Tylenol for her headaches which helps a little bit.  Her headaches occur every morning and night.  She does endorse some blurred vision but feels this is unrelated to her headache and feels it is more so related to needing glasses.  She hopes to go to get glasses soon.  No nausea or vomiting.  No abdominal pain.  She is still able to do her normal daily activities.   PERTINENT  PMH / PSH:  Past Medical History:  Diagnosis Date   Arthritis    hands and knees   Cataract    both eyes   Chronic kidney disease    Depression    Controlled with medication- patient takes effexor    Hyperlipidemia    Hypertension    Thyroid disease     OBJECTIVE:   BP 136/82    Pulse 90    Temp 99 F (37.2 C)    Wt 237 lb 9.6 oz (107.8 kg)    SpO2 99%    BMI 40.78 kg/m   Vitals:   10/26/21 1356 10/26/21 1415  BP: (!) 161/90 136/82  Pulse: (!) 101 90  Temp: 99 F (37.2 C)    SpO2: 99%     General: NAD, pleasant, able to participate in exam Cardiac: RRR, no murmurs. Respiratory: CTAB, normal effort, No wheezes, rales or rhonchi Abdomen: Obese, soft, non-tender in all quadrants  Extremities: no edema or cyanosis. Skin: warm and dry, no rashes noted Neuro: Cranial Nerves: II: Visual Fields are full. PERRL.  III,IV, VI: EOMI without ptosis or diplopia.  V: Facial sensation is symmetric to temperature VII: Facial movement is symmetric.  VIII: hearing is intact to voice XI: Shoulder shrug is symmetric. Motor: Tone is normal. Bulk is normal. 5/5 strength was present in all four extremities.  Sensory: Sensation is symmetric to light touch and temperature in face Cerebellar: FNF and HKS are intact bilaterally  Psych: Normal affect and mood  ASSESSMENT/PLAN:   Essential hypertension Elevated blood pressure at 161/90 initially, improved to 136/82.  Despite this, her home readings are consistently elevated.  On examination her lungs are clear, she had no pitting edema in lower extremities.  Her neurological exam was without abnormalities.  We will increase her losartan from 25 mg to 50 mg. -BMP today -Follow-up with me in 2 weeks for repeat BP check and BMP -Increase losartan to 50 mg daily -Continue amlodipine 10 mg (can consider  decreasing if patient feels that she is having leg edema)  Headache Could be related to elevated blood pressure, however, also consistent with tension type headache.  Neurological exam today was benign.  Discussed common reasons for tension type headache and ways to improve including increased hydration, living screen time, adequate sleep, regular meals, exercise.  Discussed medication overuse headaches and the importance of limiting medication use for headaches.       Sabino Dick, DO [facility]

## 2021-10-26 ENCOUNTER — Other Ambulatory Visit: Payer: Self-pay

## 2021-10-26 ENCOUNTER — Ambulatory Visit (INDEPENDENT_AMBULATORY_CARE_PROVIDER_SITE_OTHER): Payer: Medicare (Managed Care) | Admitting: Family Medicine

## 2021-10-26 VITALS — BP 136/82 | HR 90 | Temp 99.0°F | Wt 237.6 lb

## 2021-10-26 DIAGNOSIS — R519 Headache, unspecified: Secondary | ICD-10-CM | POA: Insufficient documentation

## 2021-10-26 DIAGNOSIS — I10 Essential (primary) hypertension: Secondary | ICD-10-CM | POA: Diagnosis not present

## 2021-10-26 MED ORDER — LOSARTAN POTASSIUM 50 MG PO TABS: 50.0000 mg | ORAL_TABLET | Freq: Every day | ORAL | 3 refills | Status: DC

## 2021-10-26 NOTE — Assessment & Plan Note (Signed)
Elevated blood pressure at 161/90 initially, improved to 136/82.  Despite this, her home readings are consistently elevated.  On examination her lungs are clear, she had no pitting edema in lower extremities.  Her neurological exam was without abnormalities.  We will increase her losartan from 25 mg to 50 mg. -BMP today -Follow-up with me in 2 weeks for repeat BP check and BMP -Increase losartan to 50 mg daily -Continue amlodipine 10 mg (can consider decreasing if patient feels that she is having leg edema)

## 2021-10-26 NOTE — Assessment & Plan Note (Signed)
Could be related to elevated blood pressure, however, also consistent with tension type headache.  Neurological exam today was benign.  Discussed common reasons for tension type headache and ways to improve including increased hydration, living screen time, adequate sleep, regular meals, exercise.  Discussed medication overuse headaches and the importance of limiting medication use for headaches.

## 2021-10-27 ENCOUNTER — Other Ambulatory Visit: Payer: Self-pay | Admitting: Family Medicine

## 2021-10-27 DIAGNOSIS — Z748 Other problems related to care provider dependency: Secondary | ICD-10-CM

## 2021-10-27 LAB — BASIC METABOLIC PANEL
BUN/Creatinine Ratio: 8 — ABNORMAL LOW (ref 12–28)
BUN: 12 mg/dL (ref 8–27)
CO2: 25 mmol/L (ref 20–29)
Calcium: 10.2 mg/dL (ref 8.7–10.3)
Chloride: 105 mmol/L (ref 96–106)
Creatinine, Ser: 1.46 mg/dL — ABNORMAL HIGH (ref 0.57–1.00)
Glucose: 85 mg/dL (ref 70–99)
Potassium: 4.1 mmol/L (ref 3.5–5.2)
Sodium: 144 mmol/L (ref 134–144)
eGFR: 38 mL/min/{1.73_m2} — ABNORMAL LOW (ref >59)

## 2021-10-28 ENCOUNTER — Telehealth: Payer: Self-pay | Admitting: *Deleted

## 2021-10-28 NOTE — Telephone Encounter (Signed)
° °  Telephone encounter was:  Successful.  10/28/2021 Name: [name] MRN: [id number] DOB: [birth year]  [name] is a 71 y.o. year old female who is a primary care patient of Westley Chandler, MD . The community resource team was consulted for assistance with Transportation Needs   Care guide performed the following interventions: Patient provided with information about care guide support team and interviewed to confirm resource needs Follow up call placed to community resources to determine status of patients referral.Called [employer] transportation to schedule 11/08/2021 transportation with patient on the line so patient has the direct number now to schedule her transportation [phone]  Follow Up Plan:  No further follow up planned at this time. The patient has been provided with needed resources.  Alois Cliche -[facility] Guide , Embedded Care Coordination [facility], Care Management  [phone] [address] [place] , [place] [address] Email : [email or web address]

## 2021-11-07 NOTE — Progress Notes (Addendum)
° ° °  SUBJECTIVE:   CHIEF COMPLAINT / HPI:   Blood Pressure F/U [name] is a 71 y.o. female who presents to the clinic today for follow up on her blood pressure. She was last seen in the clinic on 2/14 and medications were adjusted given her persistently elevated home BP readings. Current medications include Amlodipine 10 mg and Losartan 50 mg (increased from 25 mg). Reports good compliance. She brought her home blood pressure readings which are listed below. Denies chest pain, shortness of breath, headaches. Still feels like her legs are a little swollen. Seems to be worse in the morning and the afternoons. Isn't wearing compression socks. No swelling elsewhere and it is not impacting her breathing.  2/21: 147/85 2/22: 156/91 2/23 144/81 2/24: 157/98 2/25: 142/89  Dry Hands Reports eczema to her hands. Uses triamcinolone as needed for flares. Requests refill.   PERTINENT  PMH / PSH:  Past Medical History:  Diagnosis Date   Arthritis    hands and knees   Cataract    both eyes   Chronic kidney disease    Depression    Controlled with medication- patient takes effexor    Hyperlipidemia    Hypertension    Thyroid disease      OBJECTIVE:   BP 136/89    Pulse 92    Wt 238 lb 12.8 oz (108.3 kg)    SpO2 98%    BMI 40.99 kg/m   Vitals:   11/08/21 0851 11/08/21 0917  BP: (!) 144/83 136/89  Pulse: 92   SpO2: 98%    General: NAD, pleasant, able to participate in exam Cardiac: RRR, no murmurs. Respiratory: CTAB, normal effort, No wheezes, rales or rhonchi Extremities: slight non-pitting edema to b/l ankles  Skin: hands are dry, patches of hyperpigmentation around dry and calloused parts (see below) Psych: Normal affect and mood      ASSESSMENT/PLAN:   Essential hypertension Home readings slightly elevated but closer to goal (<140/90). Reassuringly headaches have improved. Given her slight edema in legs will reduce Amlodipine to 5 mg and add HCTZ 25 mg for  additional control. -BMP today -Continue Losartan 50 mg daily -Reduce Amlodipine to 5 mg daily (new Rx sent)  -START HCTZ 25 mg daily  -Continue to monitor BP at home -Discussed low salt diet and recommended physical activity -F/u with me 3/16; repeat BMP at that time   Hand eczema Recommend moisturizer/barrier creams daily.  -Refilled triamcinolone       Sabino Dick, DO [facility]

## 2021-11-07 NOTE — Patient Instructions (Addendum)
It was wonderful to see you today.  Please bring ALL of your medications with you to every visit.   Today we talked about:  -Your blood pressure today was 144/83. Continue to check your blood pressures, bring them in to your next appointment. -We are doing lab work today to check your electrolytes and kidney function. I will call you with the results.  -Medication changes today: We are REDUCING your Amlodipine dose to 5 mg daily. We are ADDING a new medication called hydrochlorothiazide. Continue to take your Losartan 50 mg.  -Return in 2 weeks for a recheck.  -Remember to take your Synthroid (Levothyroxine) first thing in the morning on an empty stomach and before taking your other medications.    Thank you for choosing [facility].   Please call [phone] with any questions about today's appointment.  Please be sure to schedule follow up at the front  desk before you leave today.   Sabino Dick, DO PGY-2 Family Medicine

## 2021-11-08 ENCOUNTER — Other Ambulatory Visit: Payer: Self-pay

## 2021-11-08 ENCOUNTER — Ambulatory Visit (INDEPENDENT_AMBULATORY_CARE_PROVIDER_SITE_OTHER): Payer: Medicare (Managed Care) | Admitting: Family Medicine

## 2021-11-08 VITALS — BP 136/89 | HR 92 | Wt 238.8 lb

## 2021-11-08 DIAGNOSIS — L309 Dermatitis, unspecified: Secondary | ICD-10-CM | POA: Insufficient documentation

## 2021-11-08 DIAGNOSIS — I10 Essential (primary) hypertension: Secondary | ICD-10-CM | POA: Diagnosis not present

## 2021-11-08 MED ORDER — TRIAMCINOLONE ACETONIDE 0.5 % EX OINT: 1.0000 "application " | TOPICAL_OINTMENT | Freq: Two times a day (BID) | CUTANEOUS | 1 refills | Status: DC

## 2021-11-08 MED ORDER — AMLODIPINE BESYLATE 5 MG PO TABS: 5.0000 mg | ORAL_TABLET | Freq: Every day | ORAL | 3 refills | Status: DC

## 2021-11-08 MED ORDER — HYDROCHLOROTHIAZIDE 25 MG PO TABS: 25.0000 mg | ORAL_TABLET | Freq: Every day | ORAL | 3 refills | Status: DC

## 2021-11-08 NOTE — Addendum Note (Signed)
Addended by: Gilberto Better R on: 11/08/2021 01:00 PM   Modules accepted: Orders

## 2021-11-08 NOTE — Assessment & Plan Note (Signed)
Recommend moisturizer/barrier creams daily.  -Refilled triamcinolone

## 2021-11-08 NOTE — Assessment & Plan Note (Addendum)
Home readings slightly elevated but closer to goal (<140/90). Reassuringly headaches have improved. Given her slight edema in legs will reduce Amlodipine to 5 mg and add HCTZ 25 mg for additional control. -BMP today -Continue Losartan 50 mg daily -Reduce Amlodipine to 5 mg daily (new Rx sent)  -START HCTZ 25 mg daily  -Continue to monitor BP at home -Discussed low salt diet and recommended physical activity -F/u with me 3/16; repeat BMP at that time

## 2021-11-09 LAB — BASIC METABOLIC PANEL
BUN/Creatinine Ratio: 8 — ABNORMAL LOW (ref 12–28)
BUN: 12 mg/dL (ref 8–27)
CO2: 23 mmol/L (ref 20–29)
Calcium: 9.9 mg/dL (ref 8.7–10.3)
Chloride: 106 mmol/L (ref 96–106)
Creatinine, Ser: 1.53 mg/dL — ABNORMAL HIGH (ref 0.57–1.00)
Glucose: 98 mg/dL (ref 70–99)
Potassium: 3.9 mmol/L (ref 3.5–5.2)
Sodium: 141 mmol/L (ref 134–144)
eGFR: 36 mL/min/{1.73_m2} — ABNORMAL LOW (ref >59)

## 2021-11-23 ENCOUNTER — Other Ambulatory Visit: Payer: Self-pay | Admitting: *Deleted

## 2021-11-23 MED ORDER — LOSARTAN POTASSIUM 50 MG PO TABS: 50.0000 mg | ORAL_TABLET | Freq: Every day | ORAL | 2 refills | Status: DC

## 2021-11-25 ENCOUNTER — Ambulatory Visit: Payer: Medicare (Managed Care)

## 2021-11-26 ENCOUNTER — Ambulatory Visit (INDEPENDENT_AMBULATORY_CARE_PROVIDER_SITE_OTHER): Payer: Medicare (Managed Care) | Admitting: Family Medicine

## 2021-11-26 ENCOUNTER — Other Ambulatory Visit: Payer: Self-pay

## 2021-11-26 VITALS — BP 132/80 | HR 85 | Ht 64.0 in | Wt 235.8 lb

## 2021-11-26 DIAGNOSIS — N183 Chronic kidney disease, stage 3 unspecified: Secondary | ICD-10-CM

## 2021-11-26 DIAGNOSIS — I10 Essential (primary) hypertension: Secondary | ICD-10-CM

## 2021-11-26 NOTE — Progress Notes (Signed)
? ? ?  SUBJECTIVE:  ? ?CHIEF COMPLAINT / HPI:  ? ?Patient reports that she is here for a blood pressure check.  She notes that she has been taking her medications and has had increased urination with her most recent addition of hydrochlorothiazide but also notes that her leg swelling has resolved after decreasing the amlodipine dose as well.  She has been checking her blood pressures at home and it typically been in the 140s/80s-150s/90s, but upon further discussion patient is usually active before and during her blood pressure measurements. ? ?PERTINENT  PMH / PSH: Reviewed ? ?OBJECTIVE:  ? ?BP 132/80   Pulse 85   Ht 5\' 4"  (1.626 m)   Wt 235 lb 12.8 oz (107 kg)   SpO2 95%   BMI 40.47 kg/m?   ?Gen: well-appearing, NAD ?CV: RRR, no m/r/g appreciated, no peripheral edema ?Pulm: CTAB, no wheezes/crackles ?GI: soft, non-tender, non-distended ? ?ASSESSMENT/PLAN:  ? ?Essential hypertension ?Still elevated BP at home with readings around 145/85, in clinic BP within goal. Discussed ideal way to take BP at home. Headaches and leg swelling have resolved.  ?- BMP today ?- Continue Losartan 50mg  daily ?- Continue Amlodipine 5mg  daily ?- Continue HCTZ 25mg  daily ?- Continue BP monitor and bring log to clinic ?- Bring BP monitor to compare to clinic measurements (home ones seem more elevated) ?- Follow-up in 2-4 weeks ? ?CHRONIC KIDNEY DISEASE STAGE III (MODERATE) ?- BMP today ?- Urine microalbumin to creatinine ratio today ?  ? ? ?Erin Utke, DO ?[place]  ?

## 2021-11-26 NOTE — Patient Instructions (Signed)
We are going to check your kidneys with labs and a urine sample today.  We will call you with the results if there is anything concerning, otherwise you may just receive a letter or a phone call with reassurance. ? ?For checking your blood pressures, I want you to do this in a cool quiet room where you can sit with your legs uncrossed and not having been talking for a couple of minutes.  Ideally your arm would be elevated up to the level of the heart resting next to you before you check your blood pressure.  Make sure that it is a cuff that goes on your upper arm.  Check your blood pressures over the next 4 weeks and bring your log with you.  Also bring your blood pressure cuff to the next visit so we can compare your measurements versus ours. ?

## 2021-11-27 LAB — BASIC METABOLIC PANEL
BUN/Creatinine Ratio: 10 — ABNORMAL LOW (ref 12–28)
BUN: 17 mg/dL (ref 8–27)
CO2: 26 mmol/L (ref 20–29)
Calcium: 10.2 mg/dL (ref 8.7–10.3)
Chloride: 100 mmol/L (ref 96–106)
Creatinine, Ser: 1.76 mg/dL — ABNORMAL HIGH (ref 0.57–1.00)
Glucose: 94 mg/dL (ref 70–99)
Potassium: 3.4 mmol/L — ABNORMAL LOW (ref 3.5–5.2)
Sodium: 141 mmol/L (ref 134–144)
eGFR: 31 mL/min/{1.73_m2} — ABNORMAL LOW (ref >59)

## 2021-11-27 LAB — MICROALBUMIN / CREATININE URINE RATIO
Creatinine, Urine: 124.9 mg/dL
Microalb/Creat Ratio: 9 mg/g creat (ref 0–29)
Microalbumin, Urine: 11.7 ug/mL

## 2021-11-27 NOTE — Assessment & Plan Note (Signed)
-   BMP today ?- Urine microalbumin to creatinine ratio today ?

## 2021-11-27 NOTE — Assessment & Plan Note (Signed)
Still elevated BP at home with readings around 145/85, in clinic BP within goal. Discussed ideal way to take BP at home. Headaches and leg swelling have resolved.  ?- BMP today ?- Continue Losartan 50mg  daily ?- Continue Amlodipine 5mg  daily ?- Continue HCTZ 25mg  daily ?- Continue BP monitor and bring log to clinic ?- Bring BP monitor to compare to clinic measurements (home ones seem more elevated) ?- Follow-up in 2-4 weeks ?

## 2021-11-29 ENCOUNTER — Encounter: Payer: Self-pay | Admitting: Family Medicine

## 2021-11-29 ENCOUNTER — Telehealth: Payer: Self-pay | Admitting: Family Medicine

## 2021-11-29 NOTE — Telephone Encounter (Signed)
Attempted to reach patient regarding recent results.  Urine albumin/creatinine ratio within normal limits.  Kidney function not largely unchanged from prior. ? ? ?Erin Salois, DO  ?

## 2021-12-27 ENCOUNTER — Ambulatory Visit (INDEPENDENT_AMBULATORY_CARE_PROVIDER_SITE_OTHER): Payer: Medicare (Managed Care) | Admitting: Family Medicine

## 2021-12-27 ENCOUNTER — Encounter: Payer: Self-pay | Admitting: Family Medicine

## 2021-12-27 ENCOUNTER — Other Ambulatory Visit: Payer: Self-pay

## 2021-12-27 VITALS — BP 160/107 | HR 91 | Wt 239.8 lb

## 2021-12-27 DIAGNOSIS — N1832 Chronic kidney disease, stage 3b: Secondary | ICD-10-CM | POA: Diagnosis not present

## 2021-12-27 DIAGNOSIS — I10 Essential (primary) hypertension: Secondary | ICD-10-CM | POA: Diagnosis not present

## 2021-12-27 MED ORDER — LOSARTAN POTASSIUM 50 MG PO TABS: 50.0000 mg | ORAL_TABLET | Freq: Every day | ORAL | 2 refills | Status: DC

## 2021-12-27 NOTE — Progress Notes (Signed)
? ? ?  SUBJECTIVE:  ? ?CHIEF COMPLAINT: Blood pressure check ?HPI:  ? ?KYNNEDI DER is a 71 y.o.  with history notable for hypertension, tobacco abuse, hypothyroidism and CKD presenting for blood pressure follow-up.  Since her last visit her grandson was killed in [place].  This is the son of her daughter.  Her son died last year.  She is very distraught understandably today.  The funeral is planned for tomorrow.  She is able to eat.  She is not set much.  She is mostly supporting her family. ? ?The patient reports she has not taken her blood pressure medications today.  She has been out of her losartan for 4 weeks.  She denies side effects of her medications.  She denies chest pain, dyspnea, headaches, lower extremity edema. ? ?The patient reports she will follow-up with pulmonary medicine eventually.  She denies cough or hemoptysis.  She is still smoking. ? ? ?PERTINENT  PMH / PSH/Family/Social History : Updated and reviewed as appropriate ? ?OBJECTIVE:  ? ?BP (!) 160/107   Pulse 91   Wt 239 lb 12.8 oz (108.8 kg)   SpO2 95%   BMI 41.16 kg/m?   ?Today's weight:  ?Last Weight  Most recent update: 12/27/2021  9:12 AM  ? ? Weight  ?108.8 kg (239 lb 12.8 oz)  ?      ? ?  ? ?Review of prior weights: ?Filed Weights  ? 12/27/21 0912  ?Weight: 239 lb 12.8 oz (108.8 kg)  ? ? ? ?Cardiac: Regular rate and rhythm. Normal S1/S2. No murmurs, rubs, or gallops appreciated. ?Lungs: Clear bilaterally to ascultation.  ?Psych: Tearful but appropriate ? ? ?ASSESSMENT/PLAN:  ? ?CHRONIC KIDNEY DISEASE STAGE III (MODERATE) ?The cause and nature and progression of chronic kidney disease was discussed at length.  She had renal imaging in the past which we will repeat given has been over a decade.  Discussed referral to nephrology to which she is agreeable.  Referral placed.  Will obtain labs at next visit she has not taken her losartan recently and she does like to avoid blood work when possible. ? ?Essential hypertension ?Not at  goal.  She has not been on losartan.  She is compliant with her other 2 medications.  I asked her to monitor her blood pressure after she restarts the medication over the next few weeks.  Follow-up in 3 to 4 weeks for blood pressure check and BMP.  At that time also obtain labs for chronic kidney disease. ? ?Reviewed reasons to present to the emergency department. ? ?Grief Reaction supportive listening provided. ? ?HCM ?At follow up--discuss Pulmonary (again), DEXA, vaccines, and colonoscopy  ? ? ? ? ?Dorris Singh, MD  ?Family Medicine Teaching Service  ?[facility]  ? ? ?

## 2021-12-27 NOTE — Assessment & Plan Note (Signed)
The cause and nature and progression of chronic kidney disease was discussed at length.  She had renal imaging in the past which we will repeat given has been over a decade.  Discussed referral to nephrology to which she is agreeable.  Referral placed.  Will obtain labs at next visit she has not taken her losartan recently and she does like to avoid blood work when possible. ?

## 2021-12-27 NOTE — Assessment & Plan Note (Signed)
Not at goal.  She has not been on losartan.  She is compliant with her other 2 medications.  I asked her to monitor her blood pressure after she restarts the medication over the next few weeks.  Follow-up in 3 to 4 weeks for blood pressure check and BMP.  At that time also obtain labs for chronic kidney disease. ? ?Reviewed reasons to present to the emergency department. ?

## 2021-12-27 NOTE — Patient Instructions (Addendum)
It was wonderful to see you today. ? ?Please bring ALL of your medications with you to every visit.  ? ?Today we talked about: ? ?-[facility] will call you about your ultrasound ? ?- You will be called by the kidney doctor (Nephrology) ? ?- I sent in your refill for losartan ? ?--Follow up in 4 weeks for labs and BP check  ? ?Please call the lung doctor ? ?Phone: [phone] ? ?Thank you for choosing [facility].  ? ?Please call [phone] with any questions about today's appointment. ? ?Please be sure to schedule follow up at the front  desk before you leave today.  ? ?Terisa Starr, MD  ?Family Medicine  ? ?

## 2022-01-05 ENCOUNTER — Ambulatory Visit (HOSPITAL_COMMUNITY): Payer: Medicare (Managed Care)

## 2022-01-24 ENCOUNTER — Telehealth: Payer: Self-pay | Admitting: Family Medicine

## 2022-01-24 ENCOUNTER — Ambulatory Visit: Payer: Medicare (Managed Care) | Admitting: Family Medicine

## 2022-01-24 NOTE — Telephone Encounter (Signed)
Attempted to call patient about missed visit. Unable to reach. ? ?Erin Starr, MD  ?Family Medicine Teaching Service  ? ?

## 2022-02-22 ENCOUNTER — Other Ambulatory Visit: Payer: Self-pay

## 2022-02-22 DIAGNOSIS — I10 Essential (primary) hypertension: Secondary | ICD-10-CM

## 2022-02-22 MED ORDER — AMLODIPINE BESYLATE 5 MG PO TABS: 5.0000 mg | ORAL_TABLET | Freq: Every day | ORAL | 3 refills | Status: DC

## 2022-05-03 ENCOUNTER — Other Ambulatory Visit: Payer: Self-pay | Admitting: *Deleted

## 2022-05-03 MED ORDER — LOSARTAN POTASSIUM 50 MG PO TABS: 50.0000 mg | ORAL_TABLET | Freq: Every day | ORAL | 0 refills | Status: DC

## 2022-06-21 ENCOUNTER — Other Ambulatory Visit: Payer: Self-pay | Admitting: *Deleted

## 2022-06-21 MED ORDER — HYDROCHLOROTHIAZIDE 25 MG PO TABS: 25.0000 mg | ORAL_TABLET | Freq: Every day | ORAL | 0 refills | Status: DC

## 2022-07-15 ENCOUNTER — Ambulatory Visit (INDEPENDENT_AMBULATORY_CARE_PROVIDER_SITE_OTHER): Payer: Medicare (Managed Care) | Admitting: Family Medicine

## 2022-07-15 ENCOUNTER — Ambulatory Visit
Admission: RE | Admit: 2022-07-15 | Discharge: 2022-07-15 | Disposition: A | Discharge status: Home / Self Care | Payer: Medicare (Managed Care) | Source: Ambulatory Visit | Attending: Family Medicine | Admitting: Family Medicine

## 2022-07-15 ENCOUNTER — Encounter: Payer: Self-pay | Admitting: Family Medicine

## 2022-07-15 VITALS — BP 139/82 | HR 85 | Ht 65.0 in | Wt 236.0 lb

## 2022-07-15 DIAGNOSIS — F32A Depression, unspecified: Secondary | ICD-10-CM

## 2022-07-15 DIAGNOSIS — R053 Chronic cough: Secondary | ICD-10-CM

## 2022-07-15 DIAGNOSIS — I10 Essential (primary) hypertension: Secondary | ICD-10-CM

## 2022-07-15 DIAGNOSIS — K219 Gastro-esophageal reflux disease without esophagitis: Secondary | ICD-10-CM

## 2022-07-15 DIAGNOSIS — Z23 Encounter for immunization: Secondary | ICD-10-CM | POA: Diagnosis not present

## 2022-07-15 DIAGNOSIS — F339 Major depressive disorder, recurrent, unspecified: Secondary | ICD-10-CM

## 2022-07-15 DIAGNOSIS — F17200 Nicotine dependence, unspecified, uncomplicated: Secondary | ICD-10-CM

## 2022-07-15 DIAGNOSIS — N183 Chronic kidney disease, stage 3 unspecified: Secondary | ICD-10-CM | POA: Diagnosis not present

## 2022-07-15 DIAGNOSIS — E039 Hypothyroidism, unspecified: Secondary | ICD-10-CM

## 2022-07-15 DIAGNOSIS — E785 Hyperlipidemia, unspecified: Secondary | ICD-10-CM

## 2022-07-15 MED ORDER — FAMOTIDINE 20 MG PO TABS: 20.0000 mg | ORAL_TABLET | Freq: Two times a day (BID) | ORAL | 3 refills | Status: DC

## 2022-07-15 MED ORDER — LOSARTAN POTASSIUM 50 MG PO TABS: 50.0000 mg | ORAL_TABLET | Freq: Every day | ORAL | 3 refills | Status: DC

## 2022-07-15 MED ORDER — TRIAMCINOLONE ACETONIDE 0.5 % EX OINT: 1.0000 | TOPICAL_OINTMENT | Freq: Two times a day (BID) | CUTANEOUS | 1 refills | Status: DC

## 2022-07-15 MED ORDER — ALBUTEROL SULFATE HFA 108 (90 BASE) MCG/ACT IN AERS: 2.0000 | INHALATION_SPRAY | Freq: Four times a day (QID) | RESPIRATORY_TRACT | 3 refills | Status: DC | PRN

## 2022-07-15 MED ORDER — HYDROCHLOROTHIAZIDE 25 MG PO TABS: 25.0000 mg | ORAL_TABLET | Freq: Every day | ORAL | 0 refills | Status: DC

## 2022-07-15 MED ORDER — DICLOFENAC SODIUM 1 % EX GEL: CUTANEOUS | 1 refills | Status: DC

## 2022-07-15 MED ORDER — AMLODIPINE BESYLATE 5 MG PO TABS: 5.0000 mg | ORAL_TABLET | Freq: Every day | ORAL | 3 refills | Status: DC

## 2022-07-15 MED ORDER — ASPIRIN 81 MG PO TBEC: 81.0000 mg | DELAYED_RELEASE_TABLET | Freq: Every day | ORAL | 3 refills | Status: DC

## 2022-07-15 MED ORDER — VENLAFAXINE HCL ER 75 MG PO CP24: 75.0000 mg | ORAL_CAPSULE | Freq: Every day | ORAL | 3 refills | Status: DC

## 2022-07-15 MED ORDER — LEVOTHYROXINE SODIUM 125 MCG PO TABS: 125.0000 ug | ORAL_TABLET | ORAL | 3 refills | Status: DC

## 2022-07-15 MED ORDER — ROSUVASTATIN CALCIUM 10 MG PO TABS: 10.0000 mg | ORAL_TABLET | Freq: Every day | ORAL | 3 refills | Status: DC

## 2022-07-15 NOTE — Assessment & Plan Note (Addendum)
Discussed that she may have COPD.  Also discussed low-dose CT.  We discussed the benefits of low-dose CT.  She declined.  Suspect her cough may be postviral or related to tobacco use or COPD.  Trial of albuterol inhaler.  Consider LAMA at follow-up.  Discussed spirometry testing for COPD which she declined at this time. Discussed quitting, she is not yet ready to quit.  Offered support when she is ready.

## 2022-07-15 NOTE — Assessment & Plan Note (Signed)
TSH today 

## 2022-07-15 NOTE — Assessment & Plan Note (Signed)
Doing well she reports some stressors at home.  Continue venlafaxine.

## 2022-07-15 NOTE — Assessment & Plan Note (Signed)
At goal metabolic panel and urine albumin today.

## 2022-07-15 NOTE — Patient Instructions (Signed)
It was wonderful to see you today.  Please bring ALL of your medications with you to every visit.   Today we talked about:  Your cough - I sent in an inhaler - Think about quitting smoking - We will check blood work today  An x-ray was ordered for you---you do not need an appointment to have this completed.  I recommend going to [facility]   If the results are normal,I will send you a letter  I will call you with results if anything is abnormal     Things to keep you healthy - Quitting smoking - A colonoscopy - A CAT scan of your lungs  Let me know if you want to talk about any of these   Please follow up in 3 months   Thank you for choosing [facility].   Please call [phone] with any questions about today's appointment.  Please be sure to schedule follow up at the front  desk before you leave today.   Dorris Singh, MD  Family Medicine

## 2022-07-15 NOTE — Progress Notes (Signed)
    SUBJECTIVE:   CHIEF COMPLAINT: medications, cough for 1 month  HPI:   [name] is a 71 y.o.  with history notable for tobacco use, HTN, and obesity presenting for follow up.  She reports compliance with her medications. She is out of amlodipine. Denies chest pain, headaches, vision changes, orthostatic symptoms.   Patient lives with her daughter and son.  She reports some stressors at home.  The patient reports 4 weeks of a cough that is mildly productive.  She denies dyspnea, chest pain, fevers or chills.  She denies lower extremity edema.  She reports her daughter has had similar symptoms.  She smokes about 1/2 pack or more a day.  PERTINENT  PMH / PSH/Family/Social History : Updated reviewed as appropriate  OBJECTIVE:   BP 139/82   Pulse 85   Ht 5\' 5"  (1.651 m)   Wt 236 lb (107 kg)   SpO2 96%   BMI 39.27 kg/m   Today's weight:  Last Weight  Most recent update: 07/15/2022 10:09 AM    Weight  107 kg (236 lb)            Review of prior weights: Filed Weights   07/15/22 1009  Weight: 236 lb (107 kg)     Cardiac: Regular rate and rhythm. Normal S1/S2. No murmurs, rubs, or gallops appreciated. Lungs: Coarse bilateraly but symmetric  Abdomen: Normoactive bowel sounds. No tenderness to deep or light palpation. No rebound or guarding.  Psych: Pleasant and appropriate    ASSESSMENT/PLAN:   Essential hypertension At goal metabolic panel and urine albumin today.  Hypothyroidism TSH today.  TOBACCO DEPENDENCE Discussed that she may have COPD.  Also discussed low-dose CT.  We discussed the benefits of low-dose CT.  She declined.  Suspect her cough may be postviral or related to tobacco use or COPD.  Trial of albuterol inhaler.  Consider LAMA at follow-up.  Discussed spirometry testing for COPD which she declined at this time.  Major depressive disorder, recurrent episode ([place]) Doing well she reports some stressors at home.  Continue venlafaxine.    Subacute cough Suspect related to viral cause or coughing CXR today PFT, management of COPD recommended, started albuterol, continue discussion  HCM Flu and PCV given today  Declined colonoscopy and low dose CT      Dorris Singh, MD  [place]

## 2022-07-16 LAB — BASIC METABOLIC PANEL
BUN/Creatinine Ratio: 9 — ABNORMAL LOW (ref 12–28)
BUN: 19 mg/dL (ref 8–27)
CO2: 25 mmol/L (ref 20–29)
Calcium: 10.1 mg/dL (ref 8.7–10.3)
Chloride: 104 mmol/L (ref 96–106)
Creatinine, Ser: 2.13 mg/dL — ABNORMAL HIGH (ref 0.57–1.00)
Glucose: 89 mg/dL (ref 70–99)
Potassium: 3.5 mmol/L (ref 3.5–5.2)
Sodium: 145 mmol/L — ABNORMAL HIGH (ref 134–144)
eGFR: 24 mL/min/{1.73_m2} — ABNORMAL LOW (ref >59)

## 2022-07-16 LAB — CBC
Hematocrit: 35.5 % (ref 34.0–46.6)
Hemoglobin: 12.2 g/dL (ref 11.1–15.9)
MCH: 30.5 pg (ref 26.6–33.0)
MCHC: 34.4 g/dL (ref 31.5–35.7)
MCV: 89 fL (ref 79–97)
Platelets: 215 10*3/uL (ref 150–450)
RBC: 4 x10E6/uL (ref 3.77–5.28)
RDW: 13.7 % (ref 11.7–15.4)
WBC: 6.9 10*3/uL (ref 3.4–10.8)

## 2022-07-16 LAB — MICROALBUMIN / CREATININE URINE RATIO
Creatinine, Urine: 163.7 mg/dL
Microalb/Creat Ratio: 7 mg/g creat (ref 0–29)
Microalbumin, Urine: 12.1 ug/mL

## 2022-07-16 LAB — TSH: TSH: 0.929 u[IU]/mL (ref 0.450–4.500)

## 2022-07-18 ENCOUNTER — Encounter: Payer: Self-pay | Admitting: Family Medicine

## 2022-07-18 ENCOUNTER — Telehealth: Payer: Self-pay | Admitting: Family Medicine

## 2022-07-18 NOTE — Telephone Encounter (Signed)
Attempted to call patient. Home number incorrect. Unable to reach on cell. Dorris Singh, MD  Family Medicine Teaching Service

## 2022-07-18 NOTE — Telephone Encounter (Signed)
Attempted to call patient X3. Also tried emergency contact, number is unavailable. Will send certified letter.  Dorris Singh, MD  Family Medicine Teaching Service

## 2022-07-20 ENCOUNTER — Encounter: Payer: Self-pay | Admitting: Family Medicine

## 2022-09-01 ENCOUNTER — Telehealth: Payer: Self-pay | Admitting: Family Medicine

## 2022-09-01 NOTE — Telephone Encounter (Signed)
Wrong telephone number listed in chart, unable to reach patient or leave a message for patient to call back and schedule the Medicare Annual Wellness Visit (AWV) virtually or by telephone.  Last AWV 02/07/19  Please schedule at anytime with [facility].    Any questions, please call me at [phone]

## 2023-02-01 ENCOUNTER — Ambulatory Visit: Payer: Medicare (Managed Care) | Admitting: Family Medicine

## 2023-03-10 NOTE — Progress Notes (Unsigned)
No show

## 2023-03-13 ENCOUNTER — Ambulatory Visit (INDEPENDENT_AMBULATORY_CARE_PROVIDER_SITE_OTHER): Payer: Medicare (Managed Care) | Admitting: Family Medicine

## 2023-03-13 DIAGNOSIS — I10 Essential (primary) hypertension: Secondary | ICD-10-CM

## 2023-03-13 DIAGNOSIS — E039 Hypothyroidism, unspecified: Secondary | ICD-10-CM

## 2023-03-13 DIAGNOSIS — N183 Chronic kidney disease, stage 3 unspecified: Secondary | ICD-10-CM

## 2023-04-17 NOTE — Progress Notes (Unsigned)
SUBJECTIVE:   CHIEF COMPLAINT: follow up labs HPI:   [name] is a 72 y.o.  with history notable for HTN, CKD III, and hypothyroidism presenting for follow-up.  She reports several concerns today  Since her last visit another grandson has been killed.  He was shot in December.  She had to attend 2 funerals in 1 day.  She reports her mood is worse.  She was told by nurse practitioner to increase her Effexor dose.  She takes 75 daily.  She reports a low mood and poor sleep.  She support through her family.  She is not interested in counseling she denies SI or HI  The patient reports 2 months of right leg swelling.  She has knee pain associate with this.  She has had 1 fall.  She uses a cane to walk.  She denies any dyspnea, chest pain or history of DVT.  She uses Tylenol for her knee pain.  The patient reports compliance with her medications.  She did take her medications today.  She has a phone with her.  Her HCTZ was stopped at her last visit due to her rising creatinine.  PERTINENT  PMH / PSH/Family/Social History : Tobacco use, cough is improved.  OBJECTIVE:   BP (!) 153/100   Pulse 76   Ht 5\' 4"  (1.626 m)   Wt 232 lb 9.6 oz (105.5 kg)   SpO2 99%   BMI 39.93 kg/m   Today's weight:  Last Weight  Most recent update: 04/18/2023 10:03 AM    Weight  105.5 kg (232 lb 9.6 oz)            Review of prior weights: Filed Weights   04/18/23 1002  Weight: 232 lb 9.6 oz (105.5 kg)  Pleasant appearing woman in no distress.  Edentulous with moist mucous membranes.  Regular rate and rhythm lungs clear bilaterally auscultation abdomen is mildly distended but soft no masses no tenderness.  Right lower extremity has 2-3+ pedal edema to the knee.  Left leg has 1+ pedal edema to the knee.  No redness or deformity she does have medial joint line tenderness on the right   ASSESSMENT/PLAN:   Essential hypertension Not at goal No symptoms of hypertensive emergency we will check a BMP  today and start Lasix or titrate her losartan pending results  CHRONIC KIDNEY DISEASE STAGE III (MODERATE) Referral to Nephrology.  Renal ultrasound today will obtain CKD labs.  Spent most of this visit discussing chronic kidney disease, I suspect the cause of her CKD is her hypertension.  Spent time discussing which medications are safe, a low-salt low potassium diet as well as the function of the kidneys in general  Major depressive disorder, recurrent episode (HCC) Currently worsened by acute stressors.  Supportive listening provided.  Offered counseling which she declined offered grief counseling which she also declined.  We will not increase her Effexor given her CKD.  Could consider addition of BuSpar or transition to Cymbalta in the future    Right leg edema Differential includes DVT which I am most concerned about however this has been months nontender.  Could also be a Baker's cyst related to arthritis.  Also considered heart failure or just progression of her ESRD.  Will get a DVT ultrasound on Friday.  Also obtain knee x-rays.  Discussed injections physical therapy and orthopedics given her fall.  She is amenable to a rollator today but will do home exercise therapies discussed she may use  Tylenol or Voltaren gel ascending for CKD 3B  Fall at home Due to edema and likely OA Ordered rollator No LOC or head trauma, not on AC Discussed lighting, rug safety   Return in 2-3 weeks for BP, low dose CT discussion Consider DEXA at upcoming visit    Terisa Starr, MD  Family Medicine Teaching Service  [facility]

## 2023-04-18 ENCOUNTER — Encounter: Payer: Self-pay | Admitting: Family Medicine

## 2023-04-18 ENCOUNTER — Ambulatory Visit (INDEPENDENT_AMBULATORY_CARE_PROVIDER_SITE_OTHER): Payer: Medicare (Managed Care) | Admitting: Family Medicine

## 2023-04-18 ENCOUNTER — Other Ambulatory Visit: Payer: Self-pay

## 2023-04-18 VITALS — BP 153/100 | HR 76 | Ht 64.0 in | Wt 232.6 lb

## 2023-04-18 DIAGNOSIS — I10 Essential (primary) hypertension: Secondary | ICD-10-CM | POA: Diagnosis not present

## 2023-04-18 DIAGNOSIS — N183 Chronic kidney disease, stage 3 unspecified: Secondary | ICD-10-CM | POA: Diagnosis not present

## 2023-04-18 DIAGNOSIS — M7989 Other specified soft tissue disorders: Secondary | ICD-10-CM

## 2023-04-18 DIAGNOSIS — E785 Hyperlipidemia, unspecified: Secondary | ICD-10-CM

## 2023-04-18 DIAGNOSIS — F32A Depression, unspecified: Secondary | ICD-10-CM

## 2023-04-18 DIAGNOSIS — M1711 Unilateral primary osteoarthritis, right knee: Secondary | ICD-10-CM

## 2023-04-18 DIAGNOSIS — E039 Hypothyroidism, unspecified: Secondary | ICD-10-CM | POA: Diagnosis not present

## 2023-04-18 DIAGNOSIS — F339 Major depressive disorder, recurrent, unspecified: Secondary | ICD-10-CM

## 2023-04-18 MED ORDER — ASPIRIN 81 MG PO TBEC: 81.0000 mg | DELAYED_RELEASE_TABLET | Freq: Every day | ORAL | 3 refills | Status: DC

## 2023-04-18 MED ORDER — ROSUVASTATIN CALCIUM 10 MG PO TABS: 10.0000 mg | ORAL_TABLET | Freq: Every day | ORAL | 3 refills | Status: DC

## 2023-04-18 MED ORDER — AMLODIPINE BESYLATE 5 MG PO TABS: 5.0000 mg | ORAL_TABLET | Freq: Every day | ORAL | 3 refills | Status: DC

## 2023-04-18 MED ORDER — LOSARTAN POTASSIUM 50 MG PO TABS: 50.0000 mg | ORAL_TABLET | Freq: Every day | ORAL | 3 refills | Status: DC

## 2023-04-18 MED ORDER — LEVOTHYROXINE SODIUM 125 MCG PO TABS: 125.0000 ug | ORAL_TABLET | ORAL | 3 refills | Status: DC

## 2023-04-18 MED ORDER — VENLAFAXINE HCL ER 75 MG PO CP24: 75.0000 mg | ORAL_CAPSULE | Freq: Every day | ORAL | 3 refills | Status: DC

## 2023-04-18 NOTE — Assessment & Plan Note (Signed)
Not at goal No symptoms of hypertensive emergency we will check a BMP today and start Lasix or titrate her losartan pending results

## 2023-04-18 NOTE — Assessment & Plan Note (Signed)
Currently worsened by acute stressors.  Supportive listening provided.  Offered counseling which she declined offered grief counseling which she also declined.  We will not increase her Effexor given her CKD.  Could consider addition of BuSpar or transition to Cymbalta in the future

## 2023-04-18 NOTE — Patient Instructions (Addendum)
It was wonderful to see you today.  Please bring ALL of your medications with you to every visit.   Today we talked about:  --- I will call you with blood work  ---For your knee you can use Ice, heat, and Tylenol - let me know if you want an injection  I recommend for the swelling An ultrasound An x-ray An x-ray was ordered for you---you do not need an appointment to have this completed.  I recommend going to [facility] [address] [place]  If the results are normal,I will send you a letter  I will call you with results if anything is abnormal   You can also get this at [facility] when you get your ultrasound  I have referred you to Nephrology  to further evaluate your concern. If you do not received a phone call about this appointment within 3-4 weeks, please call our office back at [phone]. Clemencia Course coordinates our referrals and can assist you in this.   You have chronic kidney disease The kidneys filter waste and make urine I think the cause is your blood pressure I recommend blood work today  We will check your urine at the next visit I also recommend an ultrasound  I will send in a new fluid pill pending your blood work   I sent in all of your refills and increased your Effexor Please let me know if you notice side effects from the Effexor   You are on the highest dose of Effexor for your kidney test  We can add a medication at your next visit if needed   Please follow up in 1 months   Thank you for choosing [facility].   Please call [phone] with any questions about today's appointment.  Please be sure to schedule follow up at the front  desk before you leave today.   Terisa Starr, MD  Family Medicine

## 2023-04-18 NOTE — Assessment & Plan Note (Signed)
Referral to Nephrology.  Renal ultrasound today will obtain CKD labs.  Spent most of this visit discussing chronic kidney disease, I suspect the cause of her CKD is her hypertension.  Spent time discussing which medications are safe, a low-salt low potassium diet as well as the function of the kidneys in general

## 2023-04-18 NOTE — Addendum Note (Signed)
Addended by: Manson Passey,  on: 04/18/2023 11:57 AM   Modules accepted: Orders

## 2023-04-19 ENCOUNTER — Other Ambulatory Visit: Payer: Self-pay | Admitting: Family Medicine

## 2023-04-19 ENCOUNTER — Telehealth: Payer: Self-pay | Admitting: Family Medicine

## 2023-04-19 DIAGNOSIS — E559 Vitamin D deficiency, unspecified: Secondary | ICD-10-CM

## 2023-04-19 DIAGNOSIS — N183 Chronic kidney disease, stage 3 unspecified: Secondary | ICD-10-CM

## 2023-04-19 MED ORDER — FUROSEMIDE 40 MG PO TABS: 40.0000 mg | ORAL_TABLET | Freq: Every day | ORAL | 1 refills | Status: DC

## 2023-04-19 NOTE — Telephone Encounter (Signed)
Called with results. Discussed with patient. Started Lasix. She needs repeat labs on Friday. Nursing- patient wants to get labs done at the hospital. How is best to arrange this? Ordered as future, lab collect.   Terisa Starr, MD  Family Medicine Teaching Service

## 2023-04-20 ENCOUNTER — Other Ambulatory Visit: Payer: Self-pay | Admitting: *Deleted

## 2023-04-20 DIAGNOSIS — N183 Chronic kidney disease, stage 3 unspecified: Secondary | ICD-10-CM

## 2023-04-20 NOTE — Telephone Encounter (Signed)
LMOVM informing pt that lab is available to get at hospital. Maree Erie, CMA

## 2023-04-21 ENCOUNTER — Telehealth: Payer: Self-pay | Admitting: Family Medicine

## 2023-04-21 ENCOUNTER — Ambulatory Visit (HOSPITAL_COMMUNITY)
Admission: RE | Admit: 2023-04-21 | Discharge: 2023-04-21 | Disposition: A | Discharge status: Home / Self Care | Payer: Medicare (Managed Care) | Source: Ambulatory Visit | Attending: Family Medicine | Admitting: Family Medicine

## 2023-04-21 ENCOUNTER — Ambulatory Visit (HOSPITAL_BASED_OUTPATIENT_CLINIC_OR_DEPARTMENT_OTHER)
Admission: RE | Admit: 2023-04-21 | Discharge: 2023-04-21 | Disposition: A | Discharge status: Home / Self Care | Payer: Medicare (Managed Care) | Source: Ambulatory Visit | Attending: Family Medicine | Admitting: Family Medicine

## 2023-04-21 DIAGNOSIS — M7989 Other specified soft tissue disorders: Secondary | ICD-10-CM

## 2023-04-21 DIAGNOSIS — N183 Chronic kidney disease, stage 3 unspecified: Secondary | ICD-10-CM

## 2023-04-21 NOTE — Telephone Encounter (Signed)
Called with ultrasound results Terisa Starr, MD  [facility] Medicine Teaching Service

## 2023-04-21 NOTE — Progress Notes (Signed)
Lower extremity venous duplex completed. Please see CV Procedures for preliminary results.  Shona Simpson, RVT 04/21/23 9:20 AM

## 2023-04-24 ENCOUNTER — Telehealth: Payer: Self-pay | Admitting: Family Medicine

## 2023-04-24 ENCOUNTER — Encounter: Payer: Self-pay | Admitting: Family Medicine

## 2023-04-24 ENCOUNTER — Telehealth: Payer: Self-pay

## 2023-04-24 NOTE — Telephone Encounter (Signed)
Receipt confirmed by [employer].   Hannah C Pipkin, RN  

## 2023-04-24 NOTE — Telephone Encounter (Signed)
Community message sent to [employer] for [employer] with [place].   Will await response.   Veronda Prude, RN

## 2023-04-24 NOTE — Telephone Encounter (Signed)
Attempted to call patient. Left voicemail.  If calls back please let her know ultrasound showed cyst. These are typically benign. I recommend an ultrasound in 3 months to follow up.  Let me know if she has questions.  Terisa Starr, MD  Family Medicine Teaching Service

## 2023-05-25 NOTE — Progress Notes (Deleted)
    SUBJECTIVE:   CHIEF COMPLAINT: BP check HPI:   [name] is a 72 y.o.  with history notable for tobacco use, HTN, and thyroid disease presenting for follow up.   Mood She reports   Hypertension BP goal: *** Current medications: *** Side effects: *** Adherence: ***   Tobacco Use She reports ***.  PERTINENT  PMH / PSH/Family/Social History : ***  OBJECTIVE:   There were no vitals taken for this visit.  Today's weight:  Review of prior weights: There were no vitals filed for this visit.  ***  ASSESSMENT/PLAN:   Assessment & Plan Essential hypertension  Hypothyroidism, unspecified type Repeat TSH today  Stage 3 chronic kidney disease, unspecified whether stage 3a or 3b CKD (HCC)  Renal cyst Follow up 3 months  Asymptomatic postmenopausal estrogen deficiency  TOBACCO DEPENDENCE    {    This will disappear when note is signed, click to select method of visit    :1}  Terisa Starr, MD  Family Medicine Teaching Service  [facility]

## 2023-05-25 NOTE — Assessment & Plan Note (Deleted)
Repeat TSH today

## 2023-05-26 ENCOUNTER — Ambulatory Visit: Payer: Medicare (Managed Care) | Admitting: Family Medicine

## 2023-06-06 ENCOUNTER — Other Ambulatory Visit: Payer: Self-pay | Admitting: *Deleted

## 2023-06-06 MED ORDER — FUROSEMIDE 40 MG PO TABS: 40.0000 mg | ORAL_TABLET | Freq: Every day | ORAL | 0 refills | Status: DC

## 2023-06-09 ENCOUNTER — Telehealth: Payer: Self-pay

## 2023-06-09 NOTE — Telephone Encounter (Signed)
Patient attempted to be outreached by Sofie Rower, PharmD to discuss hypertension. I was unable to leave voicemail.   Sofie Rower, PharmD, [employer] PGY1

## 2023-06-15 NOTE — Progress Notes (Signed)
SUBJECTIVE:   CHIEF COMPLAINT: discuss concerns HPI:   [name] is a 72 y.o.  with history notable for tobacco use, mood disorder, and HTN presenting for  follow up.   She reports overall she is doing better.  She did have another loss and attended a funeral this week.  Her mood is okay.  She is trying to stay active and cut down on her cigarettes as best she can.  She is not interested in quitting completely yet.  She smokes between 1/4 pack to three-quarter's of a pack a day still.  She is interested in low-dose CT.  The patient did not yet schedule her nephrology visit.  She is interested in seeing them.  She reports compliance with her blood pressure pills.  She did not take them today.  Home blood pressures are in the 170s over 90s typically.  No headaches, chest pain.  Her lower extremity edema has improved on the 5 mg of amlodipine and Lasix.  The patient reports she needs refills on most of her medications.  The patient reports she had a mammogram at a outside facility recently.  PERTINENT  PMH / PSH/Family/Social History : Updated and reviewed as appropriate  OBJECTIVE:   Pleasant appearing woman in no apparent distress.  Regular rate and rhythm on examBP (!) 149/90   Pulse 79   Wt 235 lb (106.6 kg)   SpO2 100%   BMI 40.34 kg/m   Today's weight:  Last Weight  Most recent update: 06/16/2023  9:11 AM    Weight  106.6 kg (235 lb)            Review of prior weights: [employer]   06/16/23 0910  Weight: 235 lb (106.6 kg)    Lungs clear bilaterally.  She has 1+ left lower extremity edema and none on the right today.  This is improved from prior.  ASSESSMENT/PLAN:   Assessment & Plan Hypothyroidism, unspecified type TSH today. Essential hypertension Not at goal.  BMP today.  She is already been referred to nephrology.  She is currently on losartan 50, amlodipine 5 (10 mg caused swelling), and Lasix.  She does not take the Lasix on days she is goes out.   We will increase her losartan to 100 mg anticipate at the next visit to add possibly spironolactone versus chlorthalidone. -At next visit consider urine albumin and urine creatinine.  She had 1 last year. -Scheduled for a lab visit next week and a visit with me in 2 weeks.  At her last visit she needs a BMP and lipid panel which were ordered. TOBACCO DEPENDENCE Discussed cessation at length.  Discussed benefits of cessation.  She is aware of the complications of lung cancer.  Low-dose CT ordered for screening. Hyperlipidemia, unspecified hyperlipidemia type Crestor was refilled today.  She is due for lipid panel at her next visit. Depression, unspecified depression type Doing well on Effexor.  Continue 75 mg daily.  Refilled. Encounter for immunization Shot given today. Renal cyst Discussed this is likely benign but requires follow-up.  Will order ultrasound at next visit she will need this scheduled between November and February. Stage 3 chronic kidney disease, unspecified whether stage 3a or 3b CKD (HCC) Discussed again at length.  Discussed long-term complications again.  Number given to nephrology to call.  Discussed the importance of blood pressure control. Tobacco use disorder, continuous Duplicating problem from above see above her how frequently she smokes. Tobacco abuse counseling Duplicating problem from  above discussed cessation today.  Healthcare maintenance Signed release to get her mammogram.   At next visit Ensure she has nephrology scheduled, consider discussion of DEXA and order her renal ultrasound.   Terisa Starr, MD  Family Medicine Teaching Service  [facility]

## 2023-06-16 ENCOUNTER — Other Ambulatory Visit: Payer: Self-pay | Admitting: Family Medicine

## 2023-06-16 ENCOUNTER — Encounter: Payer: Self-pay | Admitting: Family Medicine

## 2023-06-16 ENCOUNTER — Ambulatory Visit (INDEPENDENT_AMBULATORY_CARE_PROVIDER_SITE_OTHER): Payer: Medicare HMO | Admitting: Family Medicine

## 2023-06-16 VITALS — BP 149/90 | HR 79 | Wt 235.0 lb

## 2023-06-16 DIAGNOSIS — F17209 Nicotine dependence, unspecified, with unspecified nicotine-induced disorders: Secondary | ICD-10-CM

## 2023-06-16 DIAGNOSIS — E785 Hyperlipidemia, unspecified: Secondary | ICD-10-CM | POA: Diagnosis not present

## 2023-06-16 DIAGNOSIS — N281 Cyst of kidney, acquired: Secondary | ICD-10-CM | POA: Diagnosis not present

## 2023-06-16 DIAGNOSIS — I10 Essential (primary) hypertension: Secondary | ICD-10-CM

## 2023-06-16 DIAGNOSIS — E039 Hypothyroidism, unspecified: Secondary | ICD-10-CM

## 2023-06-16 DIAGNOSIS — Z23 Encounter for immunization: Secondary | ICD-10-CM | POA: Diagnosis not present

## 2023-06-16 DIAGNOSIS — F17200 Nicotine dependence, unspecified, uncomplicated: Secondary | ICD-10-CM

## 2023-06-16 DIAGNOSIS — F32A Depression, unspecified: Secondary | ICD-10-CM | POA: Diagnosis not present

## 2023-06-16 DIAGNOSIS — N183 Chronic kidney disease, stage 3 unspecified: Secondary | ICD-10-CM | POA: Diagnosis not present

## 2023-06-16 DIAGNOSIS — Z716 Tobacco abuse counseling: Secondary | ICD-10-CM

## 2023-06-16 DIAGNOSIS — Z780 Asymptomatic menopausal state: Secondary | ICD-10-CM

## 2023-06-16 MED ORDER — ASPIRIN 81 MG PO TBEC: 81.0000 mg | DELAYED_RELEASE_TABLET | Freq: Every day | ORAL | 3 refills | Status: DC

## 2023-06-16 MED ORDER — FUROSEMIDE 40 MG PO TABS: 40.0000 mg | ORAL_TABLET | Freq: Every day | ORAL | 3 refills | Status: DC

## 2023-06-16 MED ORDER — ROSUVASTATIN CALCIUM 10 MG PO TABS: 10.0000 mg | ORAL_TABLET | Freq: Every day | ORAL | 3 refills | Status: DC

## 2023-06-16 MED ORDER — LOSARTAN POTASSIUM 50 MG PO TABS: 50.0000 mg | ORAL_TABLET | Freq: Every day | ORAL | 3 refills | Status: DC

## 2023-06-16 MED ORDER — LOSARTAN POTASSIUM 100 MG PO TABS: 100.0000 mg | ORAL_TABLET | Freq: Every day | ORAL | 3 refills | Status: DC

## 2023-06-16 MED ORDER — VENLAFAXINE HCL ER 75 MG PO CP24: 75.0000 mg | ORAL_CAPSULE | Freq: Every day | ORAL | 3 refills | Status: DC

## 2023-06-16 NOTE — Assessment & Plan Note (Addendum)
Not at goal.  BMP today.  She is already been referred to nephrology.  She is currently on losartan 50, amlodipine 5 (10 mg caused swelling), and Lasix.  She does not take the Lasix on days she is goes out.  We will increase her losartan to 100 mg anticipate at the next visit to add possibly spironolactone versus chlorthalidone. -At next visit consider urine albumin and urine creatinine.  She had 1 last year. -Scheduled for a lab visit next week and a visit with me in 2 weeks.  At her last visit she needs a BMP and lipid panel which were ordered.

## 2023-06-16 NOTE — Assessment & Plan Note (Addendum)
TSH today 

## 2023-06-16 NOTE — Assessment & Plan Note (Addendum)
Discussed this is likely benign but requires follow-up.  Will order ultrasound at next visit she will need this scheduled between November and February.

## 2023-06-16 NOTE — Assessment & Plan Note (Addendum)
Discussed again at length.  Discussed long-term complications again.  Number given to nephrology to call.  Discussed the importance of blood pressure control.

## 2023-06-16 NOTE — Assessment & Plan Note (Signed)
Discussed cessation at length.  Discussed benefits of cessation.  She is aware of the complications of lung cancer.  Low-dose CT ordered for screening.

## 2023-06-16 NOTE — Patient Instructions (Addendum)
It was wonderful to see you today.  Please bring ALL of your medications with you to every visit.   Today we talked about:  --Getting a flu shot  We will you with the new time and date of your CT scan  Please follow up in 2 weeks for a blood pressure check up  INCREASE YOUR LOSARTAN FROM 50 mg to 100 mg    BRING ALL OF YOUR MEDICATIONS   Please call the kidney doctors  Address: [address], [place], [place] [address] Hours:  Open ? Closes 5?PM Phone: [phone]   1 week for a lab visit  3 weeks to check your blood pressure--we will get a kidney ultrasound ordered at this visit   Tobacco use is damaging to your body. It increases your risk of stroke, heart attack, lung cancer, and serious lung disease in the future. It also reduces your fertility.   Quitting tobacco is the best thing for your health but is a challenge---nicotine, a chemical in cigarettes, is highly addictive.   You can call 1 800 QUIT NOW (732-394-9410)---you will be connected with a Careers information officer. They can also mail you nicotine gums, lozenges, and patches to quit.   Ask me about patches (which you wear all day) and gums (which you use when you have a craving) to help you quit.   There are safe, effective medications to help you quit--  Varencline---also called Chantix---- is the most common medication used to help people stop smoking. It starts a low dose and is increased. I recommended choosing a quit date then starting the medication 8 days before this. Side effects include mild headache, difficulty sleeping, and odd dreams. The medication is typically very well tolerated.     Bupropion---also called Zyban---- is started 1 week before your quit date. You take 1 pill for three days then increase to 1 pill twice per day. Side effects include a mild headache and anxiety---this usually goes away. Some patients experience weight loss.     Thank you for choosing [facility].   Please  call [phone] with any questions about today's appointment.  Please be sure to schedule follow up at the front  desk before you leave today.   Terisa Starr, MD  Family Medicine

## 2023-06-17 LAB — BASIC METABOLIC PANEL
BUN/Creatinine Ratio: 10 — ABNORMAL LOW (ref 12–28)
BUN: 15 mg/dL (ref 8–27)
CO2: 25 mmol/L (ref 20–29)
Calcium: 9.1 mg/dL (ref 8.7–10.3)
Chloride: 107 mmol/L — ABNORMAL HIGH (ref 96–106)
Creatinine, Ser: 1.52 mg/dL — ABNORMAL HIGH (ref 0.57–1.00)
Glucose: 73 mg/dL (ref 70–99)
Potassium: 4.2 mmol/L (ref 3.5–5.2)
Sodium: 145 mmol/L — ABNORMAL HIGH (ref 134–144)
eGFR: 36 mL/min/{1.73_m2} — ABNORMAL LOW (ref >59)

## 2023-06-17 LAB — TSH: TSH: 0.963 u[IU]/mL (ref 0.450–4.500)

## 2023-06-19 ENCOUNTER — Encounter: Payer: Self-pay | Admitting: Family Medicine

## 2023-06-19 ENCOUNTER — Ambulatory Visit (HOSPITAL_COMMUNITY): Payer: Medicare HMO

## 2023-06-23 ENCOUNTER — Ambulatory Visit (HOSPITAL_COMMUNITY): Payer: Medicare HMO

## 2023-06-23 ENCOUNTER — Other Ambulatory Visit: Payer: Medicare HMO

## 2023-06-23 DIAGNOSIS — I10 Essential (primary) hypertension: Secondary | ICD-10-CM

## 2023-06-24 LAB — BASIC METABOLIC PANEL
BUN/Creatinine Ratio: 7 — ABNORMAL LOW (ref 12–28)
BUN: 12 mg/dL (ref 8–27)
CO2: 26 mmol/L (ref 20–29)
Calcium: 10 mg/dL (ref 8.7–10.3)
Chloride: 105 mmol/L (ref 96–106)
Creatinine, Ser: 1.76 mg/dL — ABNORMAL HIGH (ref 0.57–1.00)
Glucose: 78 mg/dL (ref 70–99)
Potassium: 4.1 mmol/L (ref 3.5–5.2)
Sodium: 145 mmol/L — ABNORMAL HIGH (ref 134–144)
eGFR: 31 mL/min/{1.73_m2} — ABNORMAL LOW (ref >59)

## 2023-06-24 LAB — LIPID PANEL
Chol/HDL Ratio: 3.1 {ratio} (ref 0.0–4.4)
Cholesterol, Total: 136 mg/dL (ref 100–199)
HDL: 44 mg/dL (ref >39)
LDL Chol Calc (NIH): 70 mg/dL (ref 0–99)
Triglycerides: 125 mg/dL (ref 0–149)
VLDL Cholesterol Cal: 22 mg/dL (ref 5–40)

## 2023-06-27 ENCOUNTER — Telehealth: Payer: Self-pay | Admitting: Family Medicine

## 2023-06-27 ENCOUNTER — Encounter: Payer: Self-pay | Admitting: Family Medicine

## 2023-06-27 NOTE — Telephone Encounter (Signed)
Attempted to call patient. Reached voicemail, left generic voicemail to call back. If patient calls back please let her know to continue current medications, kidney function remains similar to prior.   Erin Starr, MD  Family Medicine Teaching Service

## 2023-06-30 ENCOUNTER — Ambulatory Visit (HOSPITAL_COMMUNITY)
Admission: RE | Admit: 2023-06-30 | Discharge: 2023-06-30 | Disposition: A | Discharge status: Home / Self Care | Payer: Medicare HMO | Source: Ambulatory Visit | Attending: Family Medicine | Admitting: Family Medicine

## 2023-06-30 DIAGNOSIS — F17200 Nicotine dependence, unspecified, uncomplicated: Secondary | ICD-10-CM

## 2023-06-30 DIAGNOSIS — Z122 Encounter for screening for malignant neoplasm of respiratory organs: Secondary | ICD-10-CM | POA: Diagnosis present

## 2023-06-30 DIAGNOSIS — F17210 Nicotine dependence, cigarettes, uncomplicated: Secondary | ICD-10-CM | POA: Diagnosis not present

## 2023-07-06 NOTE — Progress Notes (Deleted)
    SUBJECTIVE:   CHIEF COMPLAINT: BP check HPI:   [name] is a 72 y.o.  with history notable for CKD and HTN presenting for follow up.  Hypertension BP goal: 130/80 or less Current medications: Losartan, Amlodipine, Laxis  Side effects: none  Adherence: ***    Tobacco use:     PERTINENT  PMH / PSH/Family/Social History : ***  OBJECTIVE:   There were no vitals taken for this visit.  Today's weight:  Review of prior weights: There were no vitals filed for this visit.  Cardiac: Regular rate and rhythm. Normal S1/S2. No murmurs, rubs, or gallops appreciated. Lungs: Clear bilaterally to ascultation.  Abdomen: Normoactive bowel sounds. No tenderness to deep or light palpation. No rebound or guarding.  ***  Psych: Pleasant and appropriate   ***  ASSESSMENT/PLAN:   Assessment & Plan Stage 3 chronic kidney disease, unspecified whether stage 3a or 3b CKD (HCC)  TOBACCO DEPENDENCE  Essential hypertension    Terisa Starr, MD  Family Medicine Teaching Service  [facility]

## 2023-07-07 ENCOUNTER — Ambulatory Visit: Payer: Medicare HMO | Admitting: Family Medicine

## 2023-07-07 DIAGNOSIS — F17200 Nicotine dependence, unspecified, uncomplicated: Secondary | ICD-10-CM

## 2023-07-07 DIAGNOSIS — I10 Essential (primary) hypertension: Secondary | ICD-10-CM

## 2023-07-07 DIAGNOSIS — N1832 Chronic kidney disease, stage 3b: Secondary | ICD-10-CM

## 2023-07-20 NOTE — Progress Notes (Signed)
SUBJECTIVE:   CHIEF COMPLAINT: BP check HPI:   [name] is a 72 y.o.  with history notable for HTN, CKD, and tobacco use presenting for follow up.  She reports she has not yet had her coffee today and did not take her blood pressure pills.  She reports she has several concerns.  She reports her hand eczema is acting up.  This flares during the summer in the winter.  She is been baking a lot with her granddaughters recently and washing her hands frequently.  She has had a longstanding history of dyshidrotic eczema.  Patient's other concern is related to her weight.  She reports a progressive increase in her weight.  She eats about once a day but is really been craving sweets.  No polyuria or polydipsia.  Her thyroid level was within normal range.  She is interested in weight loss but finds this difficult.  She would consider a medication in the future. The patient reports she did a home stool test with her insurance.  She had her mammogram done in an outside agency.. Her BP remains elevated. She has not yet seen nephrology but has an upcoming appointment.  She denies headache, chest pain, difficulty breathing.  She did not take her Lasix today since does have some lower extremity edema.    PERTINENT  PMH / PSH/Family/Social History : Updated and reviewed as appropriate  OBJECTIVE:   BP (!) 140/90   Pulse 86   Ht 5\' 4"  (1.626 m)   Wt 239 lb 12.8 oz (108.8 kg)   SpO2 100%   BMI 41.16 kg/m   Today's weight:  Last Weight  Most recent update: 07/24/2023  9:10 AM    Weight  108.8 kg (239 lb 12.8 oz)            Review of prior weights: Filed Weights   07/24/23 0910  Weight: 239 lb 12.8 oz (108.8 kg)  Pleasant but slightly tired appearing woman in no distress.  Lungs clear bilaterally.  Cardiac exam regular rate and rhythm.  She does have 1-2+ edema she did not take her Lasix today. Bilateral hands are examined she has multiple dry hyperpigmented areas with some cracking  consistent with prior diagnosis. ASSESSMENT/PLAN:   Assessment & Plan Renal cyst Repeat ultrasound ordered and scheduled. Essential hypertension Not at goal.  She did not take her medications this morning.  Instructed her to check her blood pressure 3 times a week at home and keep a log.  She has close follow-up with nephrology.  Could consider addition of Aldactone in future. Hyperlipidemia, unspecified hyperlipidemia type Refilled cholesterol pill. Hypothyroidism, unspecified type TSH normal last visit refilled today. Hypovitaminosis D Refilled today. Chronic kidney disease, stage 3b (HCC) Has an appointment in a few days with nephrology.  Deferred repeat BMP today.  She did recently increase her ARB.  She had a repeat bed metabolic panel that was appropriate.  Urine albumin today. TOBACCO DEPENDENCE Encourage cessation.  Her CT scan is not yet back. Prediabetes Discussed dietary and exercise changes.  She will keep food diary and bring this back She has had slowly progressive weight gain.  Could consider GLP-1 in the future.   Dyshidrotic eczema, refilled prescription today  Obesity, discussed at length.  Options reviewed she will keep a food diary.  Edema Needs an echo at follow up-discussed and declined today---she would like to do after her ultrasound and Nephrology visit.  Also consider pelvic imaging given her weight gain.  I suspect this is due to her renal disease, she had a negative DVT ultrasound and no anemia on evaluation.  At follow up - Discuss echo again - Ask about home stool test (has one to use) and mammogram (she will bring in papers)   Terisa Starr, MD  Family Medicine Teaching Service  [facility]

## 2023-07-24 ENCOUNTER — Ambulatory Visit (INDEPENDENT_AMBULATORY_CARE_PROVIDER_SITE_OTHER): Payer: Medicare HMO | Admitting: Family Medicine

## 2023-07-24 ENCOUNTER — Encounter: Payer: Self-pay | Admitting: Family Medicine

## 2023-07-24 VITALS — BP 140/90 | HR 86 | Ht 64.0 in | Wt 239.8 lb

## 2023-07-24 DIAGNOSIS — I10 Essential (primary) hypertension: Secondary | ICD-10-CM | POA: Diagnosis not present

## 2023-07-24 DIAGNOSIS — N1832 Chronic kidney disease, stage 3b: Secondary | ICD-10-CM

## 2023-07-24 DIAGNOSIS — E039 Hypothyroidism, unspecified: Secondary | ICD-10-CM

## 2023-07-24 DIAGNOSIS — R7303 Prediabetes: Secondary | ICD-10-CM

## 2023-07-24 DIAGNOSIS — F17200 Nicotine dependence, unspecified, uncomplicated: Secondary | ICD-10-CM

## 2023-07-24 DIAGNOSIS — N281 Cyst of kidney, acquired: Secondary | ICD-10-CM

## 2023-07-24 DIAGNOSIS — E785 Hyperlipidemia, unspecified: Secondary | ICD-10-CM | POA: Diagnosis not present

## 2023-07-24 DIAGNOSIS — E559 Vitamin D deficiency, unspecified: Secondary | ICD-10-CM

## 2023-07-24 MED ORDER — ALBUTEROL SULFATE HFA 108 (90 BASE) MCG/ACT IN AERS: 2.0000 | INHALATION_SPRAY | Freq: Four times a day (QID) | RESPIRATORY_TRACT | 3 refills | Status: DC | PRN

## 2023-07-24 MED ORDER — AMLODIPINE BESYLATE 5 MG PO TABS: 5.0000 mg | ORAL_TABLET | Freq: Every day | ORAL | 3 refills | Status: DC

## 2023-07-24 MED ORDER — DICLOFENAC SODIUM 1 % EX GEL: CUTANEOUS | 1 refills | Status: AC

## 2023-07-24 MED ORDER — ROSUVASTATIN CALCIUM 10 MG PO TABS: 10.0000 mg | ORAL_TABLET | Freq: Every day | ORAL | 3 refills | Status: DC

## 2023-07-24 MED ORDER — LEVOTHYROXINE SODIUM 125 MCG PO TABS: 125.0000 ug | ORAL_TABLET | ORAL | 3 refills | Status: DC

## 2023-07-24 MED ORDER — TRIAMCINOLONE ACETONIDE 0.5 % EX OINT: 1.0000 | TOPICAL_OINTMENT | Freq: Two times a day (BID) | CUTANEOUS | 3 refills | Status: DC

## 2023-07-24 MED ORDER — FUROSEMIDE 40 MG PO TABS: 40.0000 mg | ORAL_TABLET | Freq: Every day | ORAL | 3 refills | Status: DC

## 2023-07-24 MED ORDER — VITAMIN D (ERGOCALCIFEROL) 1.25 MG (50000 UNIT) PO CAPS: 50000.0000 [IU] | ORAL_CAPSULE | ORAL | 0 refills | Status: DC

## 2023-07-24 NOTE — Assessment & Plan Note (Addendum)
Not at goal.  She did not take her medications this morning.  Instructed her to check her blood pressure 3 times a week at home and keep a log.  She has close follow-up with nephrology.  Could consider addition of Aldactone in future.

## 2023-07-24 NOTE — Assessment & Plan Note (Signed)
TSH normal last visit refilled today.

## 2023-07-24 NOTE — Assessment & Plan Note (Addendum)
Encourage cessation.  Her CT scan is not yet back.

## 2023-07-24 NOTE — Assessment & Plan Note (Addendum)
Discussed dietary and exercise changes.  She will keep food diary and bring this back She has had slowly progressive weight gain.  Could consider GLP-1 in the future.

## 2023-07-24 NOTE — Assessment & Plan Note (Addendum)
Has an appointment in a few days with nephrology.  Deferred repeat BMP today.  She did recently increase her ARB.  She had a repeat bed metabolic panel that was appropriate.  Urine albumin today.

## 2023-07-24 NOTE — Patient Instructions (Addendum)
It was wonderful to see you today.  Please bring ALL of your medications with you to every visit.   Today we talked about:  I sent in the gel for your knees--if you have worsening pain or redness of your knees  Please log your blood pressure at home--measure 3 days a week after you have taken your medications   I sent in the ointment for your hands  Please let me know where you got your mammogram  Your CT scan of your lungs is not back yet---I will call you once it is read  Think about cutting back on smoking  For your weight  - Keep a diary of what you eat and when you eat - Try to be active--even if doing chair exercises 30 minutes 5 times a week  Please send me the results of your stool test    Please follow up in 1 months   Thank you for choosing [facility].   Please call [phone] with any questions about today's appointment.  Please be sure to schedule follow up at the front  desk before you leave today.   Terisa Starr, MD  Family Medicine

## 2023-07-24 NOTE — Assessment & Plan Note (Addendum)
Repeat ultrasound ordered and scheduled.

## 2023-07-25 ENCOUNTER — Telehealth: Payer: Self-pay | Admitting: Family Medicine

## 2023-07-25 LAB — MICROALBUMIN / CREATININE URINE RATIO
Creatinine, Urine: 95.3 mg/dL
Microalb/Creat Ratio: 47 mg/g{creat} — ABNORMAL HIGH (ref 0–29)
Microalbumin, Urine: 44.5 ug/mL

## 2023-07-25 NOTE — Telephone Encounter (Signed)
   Attempted to call X2 - unable to leave message

## 2023-07-26 ENCOUNTER — Encounter: Payer: Self-pay | Admitting: Family Medicine

## 2023-07-26 DIAGNOSIS — R809 Proteinuria, unspecified: Secondary | ICD-10-CM | POA: Insufficient documentation

## 2023-07-27 ENCOUNTER — Telehealth: Payer: Self-pay

## 2023-07-27 MED ORDER — TRIAMCINOLONE ACETONIDE 0.5 % EX OINT: 1.0000 | TOPICAL_OINTMENT | Freq: Two times a day (BID) | CUTANEOUS | 3 refills | Status: AC

## 2023-07-27 NOTE — Telephone Encounter (Signed)
Patient calls nurse line requesting a refill on Triamcinolone Ointment.   This was sent over on 11/11 by PCP, however failed to transmit.   Will resend now.

## 2023-07-28 ENCOUNTER — Other Ambulatory Visit (HOSPITAL_COMMUNITY): Payer: Medicare HMO

## 2023-08-04 ENCOUNTER — Ambulatory Visit (HOSPITAL_COMMUNITY): Payer: Medicare HMO

## 2023-08-22 ENCOUNTER — Other Ambulatory Visit: Payer: Self-pay

## 2023-08-22 DIAGNOSIS — K219 Gastro-esophageal reflux disease without esophagitis: Secondary | ICD-10-CM

## 2023-08-23 MED ORDER — FAMOTIDINE 20 MG PO TABS: 20.0000 mg | ORAL_TABLET | Freq: Two times a day (BID) | ORAL | 3 refills | Status: DC

## 2023-09-22 ENCOUNTER — Other Ambulatory Visit (HOSPITAL_COMMUNITY): Payer: Medicare HMO

## 2024-06-07 ENCOUNTER — Other Ambulatory Visit: Payer: Self-pay

## 2024-06-07 DIAGNOSIS — K219 Gastro-esophageal reflux disease without esophagitis: Secondary | ICD-10-CM

## 2024-06-07 MED ORDER — FAMOTIDINE 20 MG PO TABS: 20.0000 mg | ORAL_TABLET | Freq: Two times a day (BID) | ORAL | 0 refills | Status: DC

## 2024-06-07 NOTE — Telephone Encounter (Signed)
 [employer],  Please schedule this patient for follow up in [place] with any provider   Thanks, Suzann Daring, MD  [facility] Medicine Teaching Service

## 2024-07-04 NOTE — Assessment & Plan Note (Signed)
 Number given to [facility]  Discussed and wrote down information about uncontrolled HTN, long term risk of CV events, renal events

## 2024-07-04 NOTE — Progress Notes (Signed)
 SUBJECTIVE:   CHIEF COMPLAINT: HTN, check up HPI:   [name] is a 73 y.o.  with history notable for CKD III, HTN, tobacco use presenting for follow up.   Discussed the use of AI scribe software for clinical note transcription with the patient, who gave verbal consent to proceed.  History of Present Illness [name] is a 73 year old female with hypertension and coronary artery disease who presents with left arm pain and weakness.  Left upper extremity pain and weakness - Onset in June or July - Initial symptoms included aching pain and numbness, especially when lying on the left side and reaching over head  - Pain radiated from the shoulder to the fingers, described as severe and impairing hand use - Symptoms persisted for two to three weeks before pain subsided - Persistent weakness remains, particularly on the left side, making it difficult to perform tasks such as picking up a coffee cup - No associated difficulty speaking or walking - No incontinence or numbness in other areas except the left arm - No history of neck problems or surgeries - Uses Tylenol  for pain management - No difficulty speaking or walking - No headaches - History of migraines, but none recently  HTN She is taking only her Lasix , amlodipine  She is out of losartan  No chest pain, dyspnea, palpitations    Hypothyroidism Has not taken levothyroxine  in 1 week      PERTINENT  PMH / PSH/Family/Social History : renal cyst requiring follow up CKD IIIb- has seen Nephro X2   OBJECTIVE:   BP (!) 173/98   Pulse 88   Ht 5' 4 (1.626 m)   Wt 235 lb 6.4 oz (106.8 kg)   SpO2 99%   BMI 40.41 kg/m   Today's weight:  Last Weight  Most recent update: 07/05/2024 11:14 AM    Weight  106.8 kg (235 lb 6.4 oz)            Review of prior weights: Filed Weights   07/05/24 1113  Weight: 235 lb 6.4 oz (106.8 kg)    RRR Lungs clear bilaterally no wheezes today  Neck/shoulder Good ROM  of neck and shoulder No painful arc TTP along L trapezius Negative Hawkin's  + Spurlings Grip strength 4/5 on L as compared to 5/5 on R   ASSESSMENT/PLAN:   Assessment & Plan Essential hypertension Not at goal Out of medication BMP today Follow up in 1 month Refilled ARB, amlodipine , lasix  May need beta blocker or spironolactone  Hypothyroidism, unspecified type TSH today Refilled levothryoxine  Chronic kidney disease, stage 3b (HCC) Number given to [facility]  Discussed and wrote down information about uncontrolled HTN, long term risk of CV events, renal events  Episode of recurrent major depressive disorder, unspecified depression episode severity Supportive listening provided Now caring for her 91 and 53 year old grandchildren   TOBACCO DEPENDENCE Counseled on cessation Declined low dose CT  Encounter for screening mammogram for malignant neoplasm of breast Declined mammogram--order at follow up  Hyperlipidemia, unspecified hyperlipidemia type Refilled statin  Left arm pain With neck pain and weakness on exam Concern for cervical spine nerve root impingement No lower extremity/bowel/bladder issues  Encounter for immunization Flu shot today  Discussed benefits and side effects  Food insecurity Has 8 and 9 year grandkids now at home Food bags given today   Follow up 1 month Wants to wait on CT chest and mammogram until then   Suzann Daring, MD  Family Medicine Teaching Service  [facility]

## 2024-07-04 NOTE — Patient Instructions (Incomplete)
 It was wonderful to see you today.  Please bring ALL of your medications with you to every visit.   Today we talked about:  Follow up 1 month  For your LEFT arm pain - you can use Tylenol  or [place] - You need an xray first then a CT scan  An x-ray was ordered for you---you do not need an appointment to have this completed.  I recommend going to [facility] [address] [place] [place]  If the results are normal,I will send you a letter  I will call you with results if anything is abnormal   I refilled your medications      Our lab at our clinic is closed today. I apologize for this issue.  You can  Go to [address] first floor lab corp today (across the street near Cardiology)    If you choose to return for a later date, please schedule a visit  The orders are placed     Please call [facility] to schedule follow up [phone]  Why do I care so much about high blood pressure and kidneys? High blood pressure is a big risk factor for heart attack and stroke  Long term high blood pressure can also cause kidney damage---some folks even need dialysis due to kidney disease from high blood pressure    Thank you for choosing [facility].   Please call [phone] with any questions about today's appointment.  Please be sure to schedule follow up at the front  desk before you leave today.   Suzann Daring, MD  Family Medicine

## 2024-07-05 ENCOUNTER — Encounter: Payer: Self-pay | Admitting: Family Medicine

## 2024-07-05 ENCOUNTER — Ambulatory Visit (INDEPENDENT_AMBULATORY_CARE_PROVIDER_SITE_OTHER): Admitting: Family Medicine

## 2024-07-05 ENCOUNTER — Ambulatory Visit
Admission: RE | Admit: 2024-07-05 | Discharge: 2024-07-05 | Disposition: A | Discharge status: Home / Self Care | Source: Ambulatory Visit | Attending: Family Medicine | Admitting: Family Medicine

## 2024-07-05 VITALS — BP 173/98 | HR 88 | Ht 64.0 in | Wt 235.4 lb

## 2024-07-05 DIAGNOSIS — Z5941 Food insecurity: Secondary | ICD-10-CM

## 2024-07-05 DIAGNOSIS — F339 Major depressive disorder, recurrent, unspecified: Secondary | ICD-10-CM | POA: Diagnosis not present

## 2024-07-05 DIAGNOSIS — Z23 Encounter for immunization: Secondary | ICD-10-CM | POA: Diagnosis not present

## 2024-07-05 DIAGNOSIS — M79602 Pain in left arm: Secondary | ICD-10-CM

## 2024-07-05 DIAGNOSIS — Z1231 Encounter for screening mammogram for malignant neoplasm of breast: Secondary | ICD-10-CM

## 2024-07-05 DIAGNOSIS — I10 Essential (primary) hypertension: Secondary | ICD-10-CM | POA: Diagnosis not present

## 2024-07-05 DIAGNOSIS — N1832 Chronic kidney disease, stage 3b: Secondary | ICD-10-CM

## 2024-07-05 DIAGNOSIS — E039 Hypothyroidism, unspecified: Secondary | ICD-10-CM

## 2024-07-05 DIAGNOSIS — F17200 Nicotine dependence, unspecified, uncomplicated: Secondary | ICD-10-CM

## 2024-07-05 DIAGNOSIS — E785 Hyperlipidemia, unspecified: Secondary | ICD-10-CM

## 2024-07-05 DIAGNOSIS — R7303 Prediabetes: Secondary | ICD-10-CM

## 2024-07-05 MED ORDER — AMLODIPINE BESYLATE 5 MG PO TABS: 5.0000 mg | ORAL_TABLET | Freq: Every day | ORAL | 3 refills | Status: AC

## 2024-07-05 MED ORDER — ROSUVASTATIN CALCIUM 10 MG PO TABS: 10.0000 mg | ORAL_TABLET | Freq: Every day | ORAL | 3 refills | Status: AC

## 2024-07-05 MED ORDER — ALBUTEROL SULFATE HFA 108 (90 BASE) MCG/ACT IN AERS: 2.0000 | INHALATION_SPRAY | Freq: Four times a day (QID) | RESPIRATORY_TRACT | 3 refills | Status: AC | PRN

## 2024-07-05 MED ORDER — FUROSEMIDE 40 MG PO TABS: 40.0000 mg | ORAL_TABLET | Freq: Every day | ORAL | 3 refills | Status: AC

## 2024-07-05 MED ORDER — LEVOTHYROXINE SODIUM 125 MCG PO TABS: 125.0000 ug | ORAL_TABLET | ORAL | 3 refills | Status: AC

## 2024-07-05 MED ORDER — LOSARTAN POTASSIUM 100 MG PO TABS: 100.0000 mg | ORAL_TABLET | Freq: Every day | ORAL | 3 refills | Status: AC

## 2024-07-05 NOTE — Assessment & Plan Note (Signed)
 Supportive listening provided Now caring for her 52 and 72 year old grandchildren

## 2024-07-05 NOTE — Assessment & Plan Note (Signed)
 Counseled on cessation Declined low dose CT

## 2024-07-05 NOTE — Assessment & Plan Note (Signed)
 TSH today Refilled levothryoxine

## 2024-07-05 NOTE — Assessment & Plan Note (Signed)
 Not at goal Out of medication BMP today Follow up in 1 month Refilled ARB, amlodipine , lasix  May need beta blocker or spironolactone

## 2024-07-06 LAB — BASIC METABOLIC PANEL WITH GFR
BUN/Creatinine Ratio: 7 — ABNORMAL LOW (ref 12–28)
BUN: 13 mg/dL (ref 8–27)
CO2: 22 mmol/L (ref 20–29)
Calcium: 10.2 mg/dL (ref 8.7–10.3)
Chloride: 104 mmol/L (ref 96–106)
Creatinine, Ser: 1.85 mg/dL — ABNORMAL HIGH (ref 0.57–1.00)
Glucose: 88 mg/dL (ref 70–99)
Potassium: 4 mmol/L (ref 3.5–5.2)
Sodium: 141 mmol/L (ref 134–144)
eGFR: 29 mL/min/1.73 — ABNORMAL LOW (ref >59)

## 2024-07-06 LAB — CBC
Hematocrit: 39.2 % (ref 34.0–46.6)
Hemoglobin: 13.3 g/dL (ref 11.1–15.9)
MCH: 31.1 pg (ref 26.6–33.0)
MCHC: 33.9 g/dL (ref 31.5–35.7)
MCV: 92 fL (ref 79–97)
Platelets: 214 x10E3/uL (ref 150–450)
RBC: 4.28 x10E6/uL (ref 3.77–5.28)
RDW: 13.1 % (ref 11.7–15.4)
WBC: 6.5 x10E3/uL (ref 3.4–10.8)

## 2024-07-06 LAB — TSH: TSH: 2.69 u[IU]/mL (ref 0.450–4.500)

## 2024-07-08 ENCOUNTER — Other Ambulatory Visit: Payer: Self-pay

## 2024-07-08 ENCOUNTER — Ambulatory Visit: Payer: Self-pay | Admitting: Family Medicine

## 2024-07-08 DIAGNOSIS — M5412 Radiculopathy, cervical region: Secondary | ICD-10-CM

## 2024-07-09 ENCOUNTER — Other Ambulatory Visit: Payer: Self-pay

## 2024-07-09 DIAGNOSIS — F32A Depression, unspecified: Secondary | ICD-10-CM

## 2024-07-09 DIAGNOSIS — E559 Vitamin D deficiency, unspecified: Secondary | ICD-10-CM

## 2024-07-09 DIAGNOSIS — I10 Essential (primary) hypertension: Secondary | ICD-10-CM

## 2024-07-09 MED ORDER — ASPIRIN 81 MG PO TBEC: 81.0000 mg | DELAYED_RELEASE_TABLET | Freq: Every day | ORAL | 3 refills | Status: AC

## 2024-07-09 MED ORDER — VENLAFAXINE HCL ER 75 MG PO CP24: 75.0000 mg | ORAL_CAPSULE | Freq: Every day | ORAL | 3 refills | Status: AC

## 2024-07-09 MED ORDER — VITAMIN D (ERGOCALCIFEROL) 1.25 MG (50000 UNIT) PO CAPS: 50000.0000 [IU] | ORAL_CAPSULE | ORAL | 0 refills | Status: AC

## 2024-07-09 NOTE — Telephone Encounter (Signed)
 Called patient. Confirmed DOB. Reviewed Xray. Having L arm pain, weakness. CT ordered. Discussed her lab results as well. Recommended Nephrology follow up  All questions answered Suzann Daring, MD  [facility] Medicine Teaching Service

## 2024-07-09 NOTE — Addendum Note (Signed)
 Addended by: DELORES, Lucia Harm on: 07/09/2024 03:46 PM   Modules accepted: Orders

## 2024-07-12 ENCOUNTER — Ambulatory Visit
Admission: RE | Admit: 2024-07-12 | Discharge: 2024-07-12 | Disposition: A | Discharge status: Home / Self Care | Source: Ambulatory Visit | Attending: Family Medicine | Admitting: Family Medicine

## 2024-07-12 DIAGNOSIS — N1832 Chronic kidney disease, stage 3b: Secondary | ICD-10-CM

## 2024-07-18 ENCOUNTER — Other Ambulatory Visit: Payer: Self-pay

## 2024-07-18 DIAGNOSIS — F32A Depression, unspecified: Secondary | ICD-10-CM

## 2024-07-18 NOTE — Telephone Encounter (Signed)
 Just refilled medication last week--please call pharmacy and see if they need a new Rx?  Thanks Suzann Daring, MD  Family Medicine Teaching Service

## 2024-08-01 ENCOUNTER — Ambulatory Visit

## 2024-08-01 VITALS — Ht 64.0 in | Wt 235.0 lb

## 2024-08-01 DIAGNOSIS — Z0000 Encounter for general adult medical examination without abnormal findings: Secondary | ICD-10-CM

## 2024-08-01 NOTE — Progress Notes (Signed)
 I connected with  [name] on 08/01/24 by a audio enabled telemedicine application and verified that I am speaking with the correct person using two identifiers.  Patient Location: Home  Provider Location: Office/Clinic  Persons Participating in Visit: Patient.  I discussed the limitations of evaluation and management by telemedicine. The patient expressed understanding and agreed to proceed.   Vital Signs: Because this visit was a virtual/telehealth visit, some criteria may be missing or patient reported. Any vitals not documented were not able to be obtained and vitals that have been documented are patient reported.     Chief Complaint  Patient presents with   Medicare Wellness    SUBSEQUENT     Subjective:   [name] is a 73 y.o. female who presents for a Medicare Annual Wellness Visit.  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   Tobacco Use   Smoking status: Every Day    Current packs/day: 0.50    Average packs/day: 0.5 packs/day for 42.9 years (21.4 ttl pk-yrs)    Types: Cigarettes    Start date: 09/12/1981   Smokeless tobacco: Never  Vaping Use   Vaping status: Never Used  Substance and Sexual Activity   Alcohol use: Yes    Alcohol/week: 2.0 standard drinks of alcohol    Types: 2 Standard drinks or equivalent per week    Comment: occassional    Drug use: No   Sexual activity: Not Currently    Birth control/protection: Post-menopausal   Tobacco  Counseling Ready to quit: Not Answered Counseling given: Not Answered  SDOH Screenings   Food Insecurity: No Food Insecurity (08/01/2024)  Recent Concern: Food Insecurity - Food Insecurity Present (07/05/2024)  Housing: Low Risk  (08/01/2024)  Transportation Needs: No Transportation Needs (08/01/2024)  Utilities: Not At Risk (08/01/2024)  Depression (PHQ2-9): Low Risk  (08/01/2024)  Financial Resource Strain: Low Risk  (02/07/2019)  Physical Activity: Sufficiently Active (08/01/2024)  Social Connections: Moderately Isolated (08/01/2024)  Stress: No Stress Concern Present (08/01/2024)  Tobacco Use: High Risk (08/01/2024)  Health Literacy: Adequate Health Literacy (08/01/2024)   See flowsheets for full screening details  Depression Screen PHQ 2 & 9 Depression Scale- Over the past 2 weeks, how often have you been bothered by any of the following problems? Little interest or pleasure in doing things: 0 Feeling down, depressed, or hopeless (PHQ Adolescent also includes...irritable): 0 PHQ-2 Total Score: 0 Trouble falling or staying asleep, or sleeping too much: 0 Feeling tired or having little energy: 0 Poor appetite or overeating (PHQ Adolescent also includes...weight loss): 1 Feeling bad about yourself - or that you are a failure or have let yourself or your family down: 0 Trouble concentrating on things, such as reading the newspaper or watching television (PHQ Adolescent also includes...like school work): 0 Moving or speaking so slowly that other people could have noticed. Or the opposite - being so  fidgety or restless that you have been moving around a lot more than usual: 0 Thoughts that you would be better off dead, or of hurting yourself in some way: 0 PHQ-9 Total Score: 1 If you checked off any problems, how difficult have these problems made it for you to do your work, take care of things at home, or get along with other people?: Not difficult at all     Goals Addressed              This Visit's Progress    08/01/2024: To stay healthy, alive, cut back on smoking and take a vacation.         Visit info / Clinical Intake: Medicare Wellness Visit Type:: Subsequent Annual Wellness Visit Persons participating in visit:: patient Medicare Wellness Visit Mode:: Telephone If telephone:: video declined Because this visit was a virtual/telehealth visit:: pt reported vitals If Telephone or Video please confirm:: I connected with the patient using audio enabled telemedicine application and verified that I am speaking with the correct person using two identifiers; The patient expressed understanding and agreed to proceed; I discussed the limitations of evaluation and management by telemedicine Patient Location:: HOME Provider Location:: OFFICE Information given by:: patient Interpreter Needed?: No Pre-visit prep was completed: yes AWV questionnaire completed by patient prior to visit?: no Living arrangements:: with family/others Patient's Overall Health Status Rating: very good Typical amount of pain: none Does pain affect daily life?: no Are you currently prescribed opioids?: no  Dietary Habits and Nutritional Risks How many meals a day?: (!) 1 Eats fruit and vegetables daily?: yes Most meals are obtained by: preparing own meals In the last 2 weeks, have you had any of the following?: none Diabetic:: no  Functional Status Activities of Daily Living (to include ambulation/medication): Independent Ambulation: Independent with device- listed below Home Assistive Devices/Equipment: Dentures (specify type); Eyeglasses Medication Administration: Independent Home Management: Independent Manage your own finances?: yes Primary transportation is: driving Concerns about vision?: no *vision screening is required for WTM* Concerns about hearing?: no  Fall Screening Falls in the past year?: 0 Number of falls in past year: 0 Was there an injury with Fall?: 0 Fall  Risk Category Calculator: 0 Patient Fall Risk Level: Low Fall Risk  Fall Risk Patient at Risk for Falls Due to: No Fall Risks Fall risk Follow up: Falls evaluation completed; Education provided  Home and Transportation Safety: All rugs have non-skid backing?: N/A, no rugs All stairs or steps have railings?: N/A, no stairs (4-5 STEPS FRONT & BACK ENTRY) Grab bars in the bathtub or shower?: (!) no Have non-skid surface in bathtub or shower?: yes (MAT) Good home lighting?: yes Regular seat belt use?: yes Hospital stays in the last year:: no  Cognitive Assessment Difficulty concentrating, remembering, or making decisions? : no (BSE: READING, GAMES ON PHONE, JIGSAW PUZZLES) Will 6CIT or Mini Cog be Completed: no 6CIT or Mini Cog Declined: patient alert, oriented, able to answer questions appropriately and recall recent events  Advance Directives (For Healthcare) Does Patient Have a Medical Advance Directive?: No Would patient like information on creating a medical advance directive?: No - Patient declined  Reviewed/Updated  Reviewed/Updated: Reviewed All (Medical, Surgical, Family, Medications, Allergies, Care Teams, Patient Goals)        Objective:    Today's Vitals   08/01/24 1112  Weight: 235 lb (106.6 kg)  Height: 5' 4 (1.626 m)  PainSc: 0-No pain   Body mass index is 40.34 kg/m.  Current Medications (verified)  Outpatient Encounter Medications as of 08/01/2024  Medication Sig   acetaminophen  (TYLENOL ) 500 MG tablet Take 1 tablet (500 mg total) by mouth every 6 (six) hours as needed.   albuterol  (VENTOLIN  HFA) 108 (90 Base) MCG/ACT inhaler Inhale 2 puffs into the lungs every 6 (six) hours as needed for wheezing or shortness of breath.   amLODipine  (NORVASC ) 5 MG tablet Take 1 tablet (5 mg total) by mouth daily.   aspirin  EC 81 MG tablet Take 1 tablet (81 mg total) by mouth daily. Swallow whole.   diclofenac  Sodium (VOLTAREN ) 1 % GEL Use four times daily on affected  area   famotidine  (PEPCID ) 20 MG tablet Take 1 tablet (20 mg total) by mouth 2 (two) times daily. SCHEDULE VISIT BEFORE NEXT FILL   furosemide  (LASIX ) 40 MG tablet Take 1 tablet (40 mg total) by mouth daily. Needs visit before next fill   levothyroxine  (SYNTHROID ) 125 MCG tablet Take 1 tablet (125 mcg total) by mouth every morning. 30 minutes before food   losartan  (COZAAR ) 100 MG tablet Take 1 tablet (100 mg total) by mouth at bedtime.   rosuvastatin  (CRESTOR ) 10 MG tablet Take 1 tablet (10 mg total) by mouth daily.   triamcinolone  ointment (KENALOG ) 0.5 % Apply 1 Application topically 2 (two) times daily.   venlafaxine  XR (EFFEXOR -XR) 75 MG 24 hr capsule Take 1 capsule (75 mg total) by mouth daily.   Vitamin D , Ergocalciferol , (DRISDOL ) 1.25 MG (50000 UNIT) CAPS capsule Take 1 capsule (50,000 Units total) by mouth every 7 (seven) days.   No facility-administered encounter medications on file as of 08/01/2024.   Hearing/Vision screen Hearing Screening - Comments:: Denies hearing difficulties.  Vision Screening - Comments:: Wears rx glasses - up to date with routine eye exams with [facility]  Immunizations and Health Maintenance Health Maintenance  Topic Date Due   Zoster Vaccines- Shingrix  (1 of 2) Never done   Bone Density Scan  Never done   Colonoscopy  01/24/2021   Mammogram  06/11/2023   COVID-19 Vaccine (3 - 2025-26 season) 05/13/2024   Lung Cancer Screening  06/29/2024   Medicare Annual Wellness (AWV)  08/01/2025   DTaP/Tdap/Td (3 - Td or Tdap) 02/04/2027   Pneumococcal Vaccine: 50+ Years  Completed   Influenza Vaccine  Completed   Hepatitis C Screening  Completed   Meningococcal B Vaccine  Aged Out        Assessment/Plan:  This is a routine wellness examination for [name].  Patient Care Team: Delores Suzann HERO, MD as PCP - General (Family Medicine) Harrietta Kurtz, OD as Consulting Physician (Optometry)  I have personally reviewed and noted the following in the  patient's chart:   Medical and social history Use of alcohol, tobacco or illicit drugs  Current medications and supplements including opioid prescriptions. Functional ability and status Nutritional status Physical activity Advanced directives List of other physicians Hospitalizations, surgeries, and ER visits in previous 12 months Vitals Screenings to include cognitive, depression, and falls Referrals and appointments  No orders of the defined types were placed in this encounter.  In addition, I have reviewed and discussed with patient certain preventive protocols, quality metrics, and best practice recommendations. A written personalized care plan for preventive services as well as general preventive health recommendations were provided to patient.   Roz LOISE Fuller, LPN   [id number]/[id number]   Return in about 1 year (around 08/01/2025) for Medicare wellness.  After Visit Summary: (Declined) Due to this being a telephonic visit, with patients personalized  plan was offered to patient but patient Declined AVS at this time   Nurse Notes: Patient aware of current care gaps.  Patient stated that she will take care of gaps in 2026.

## 2024-08-01 NOTE — Patient Instructions (Signed)
 Ms. Werntz,  Thank you for taking the time for your Medicare Wellness Visit. I appreciate your continued commitment to your health goals. Please review the care plan we discussed, and feel free to reach out if I can assist you further.  Please note that Annual Wellness Visits do not include a physical exam. Some assessments may be limited, especially if the visit was conducted virtually. If needed, we may recommend an in-person follow-up with your provider.  Ongoing Care Seeing your primary care provider every 3 to 6 months helps us  monitor your health and provide consistent, personalized care.   Referrals If a referral was made during today's visit and you haven't received any updates within two weeks, please contact the referred provider directly to check on the status.  Recommended Screenings:  Health Maintenance  Topic Date Due   Zoster (Shingles) Vaccine (1 of 2) Never done   Osteoporosis screening with Bone Density Scan  Never done   Medicare Annual Wellness Visit  02/07/2020   Colon Cancer Screening  01/24/2021   Breast Cancer Screening  06/11/2023   COVID-19 Vaccine (3 - 2025-26 season) 05/13/2024   Screening for Lung Cancer  06/29/2024   DTaP/Tdap/Td vaccine (3 - Td or Tdap) 02/04/2027   Pneumococcal Vaccine for age over 67  Completed   Flu Shot  Completed   Hepatitis C Screening  Completed   Meningitis B Vaccine  Aged Out       08/01/2024   11:14 AM  Advanced Directives  Does Patient Have a Medical Advance Directive? No  Would patient like information on creating a medical advance directive? No - Patient declined    Vision: Annual vision screenings are recommended for early detection of glaucoma, cataracts, and diabetic retinopathy. These exams can also reveal signs of chronic conditions such as diabetes and high blood pressure.  Dental: Annual dental screenings help detect early signs of oral cancer, gum disease, and other conditions linked to overall health,  including heart disease and diabetes.  Please see the attached documents for additional preventive care recommendations.

## 2024-08-29 ENCOUNTER — Other Ambulatory Visit: Payer: Self-pay | Admitting: *Deleted

## 2024-08-29 DIAGNOSIS — K219 Gastro-esophageal reflux disease without esophagitis: Secondary | ICD-10-CM

## 2024-08-29 MED ORDER — FAMOTIDINE 20 MG PO TABS: 20.0000 mg | ORAL_TABLET | Freq: Two times a day (BID) | ORAL | 0 refills | Status: AC

## 2024-08-29 NOTE — Progress Notes (Unsigned)
° ° °  SUBJECTIVE:   CHIEF COMPLAINT: check up on arm pain  HPI:   ZORAH BACKES is a 73 y.o.  with history notable for HTN, CKD IIIB presenting for follow up on neck and arm pain.   Discussed the use of AI scribe software for clinical note transcription with the patient, who gave verbal consent to proceed.  History of Present Illness      PERTINENT  PMH / PSH/Family/Social History : HTN, tobacco use, hypothyroidism   OBJECTIVE:   There were no vitals taken for this visit.  Today's weight:  Review of prior weights: There were no vitals filed for this visit.   Cardiac: Regular rate and rhythm. Normal S1/S2. No murmurs, rubs, or gallops appreciated. Lungs: Clear bilaterally to ascultation.  Abdomen: Normoactive bowel sounds. No tenderness to deep or light palpation. No rebound or guarding.  ***  Psych: Pleasant and appropriate    ASSESSMENT/PLAN:   Assessment & Plan Essential hypertension  Hypothyroidism, unspecified type  Chronic kidney disease, stage 3b (HCC)  TOBACCO DEPENDENCE  Moderate episode of recurrent major depressive disorder (HCC)  Encounter for screening mammogram for malignant neoplasm of breast     Suzann Daring, MD  Family Medicine Teaching Service  [facility]

## 2024-08-30 ENCOUNTER — Encounter: Payer: Self-pay | Admitting: Family Medicine

## 2024-08-30 ENCOUNTER — Ambulatory Visit: Admitting: Family Medicine

## 2024-08-30 VITALS — BP 152/90 | HR 95 | Ht 64.0 in | Wt 233.0 lb

## 2024-08-30 DIAGNOSIS — Z1231 Encounter for screening mammogram for malignant neoplasm of breast: Secondary | ICD-10-CM | POA: Diagnosis not present

## 2024-08-30 DIAGNOSIS — F331 Major depressive disorder, recurrent, moderate: Secondary | ICD-10-CM

## 2024-08-30 DIAGNOSIS — R1030 Lower abdominal pain, unspecified: Secondary | ICD-10-CM

## 2024-08-30 DIAGNOSIS — E039 Hypothyroidism, unspecified: Secondary | ICD-10-CM

## 2024-08-30 DIAGNOSIS — N281 Cyst of kidney, acquired: Secondary | ICD-10-CM | POA: Diagnosis not present

## 2024-08-30 DIAGNOSIS — Z5941 Food insecurity: Secondary | ICD-10-CM

## 2024-08-30 DIAGNOSIS — F17200 Nicotine dependence, unspecified, uncomplicated: Secondary | ICD-10-CM

## 2024-08-30 DIAGNOSIS — N1832 Chronic kidney disease, stage 3b: Secondary | ICD-10-CM

## 2024-08-30 DIAGNOSIS — I10 Essential (primary) hypertension: Secondary | ICD-10-CM

## 2024-08-30 MED ORDER — METOPROLOL SUCCINATE ER 25 MG PO TB24: 25.0000 mg | ORAL_TABLET | Freq: Every day | ORAL | 3 refills | Status: AC

## 2024-08-30 MED ORDER — ONDANSETRON 4 MG PO TBDP: 4.0000 mg | ORAL_TABLET | Freq: Three times a day (TID) | ORAL | 0 refills | Status: AC | PRN

## 2024-08-30 NOTE — Assessment & Plan Note (Signed)
 Obesity with complication of HTN  Not at goal Add metoprolol XL 25 mg  Cannot remember to take PM medications so will avoid carvedilol Does not want another medication that contributes to urinary frequency

## 2024-08-30 NOTE — Assessment & Plan Note (Signed)
 BMP today

## 2024-08-30 NOTE — Patient Instructions (Addendum)
 It was wonderful to see you today.  Please bring ALL of your medications with you to every visit.   Today we talked about:  We will schedule your CAT scans for a Friday  I recommend you undergo a mammogram.   You can call to schedule an appointment by calling [phone].   Directions [address] [place], [place] [address]  Please let me know if you have questions. I will send you a letter or call you with results.   We will check blood work  I sent in Zofran for nausea  Continue ALL Of the pills you already take  Start Metoprolol 25 mg   Please follow up in about 2 weeks   Thank you for choosing [facility].   Please call [phone] with any questions about today's appointment.  Please be sure to schedule follow up at the front  desk before you leave today.   Suzann Daring, MD  Family Medicine

## 2024-08-30 NOTE — Assessment & Plan Note (Signed)
 Discussed cessation CT ordered, discussed possible incidental findings, false positives

## 2024-08-30 NOTE — Assessment & Plan Note (Signed)
 Doing okay  Continue Effexor  Supportive listening provided

## 2024-08-30 NOTE — Assessment & Plan Note (Signed)
 Needs annual renal ultrasound for cyst

## 2024-08-31 LAB — HEPATIC FUNCTION PANEL
ALT: 5 IU/L (ref 0–32)
AST: 13 IU/L (ref 0–40)
Albumin: 4.1 g/dL (ref 3.8–4.8)
Alkaline Phosphatase: 116 IU/L (ref 49–135)
Bilirubin Total: 0.4 mg/dL (ref 0.0–1.2)
Bilirubin, Direct: 0.15 mg/dL (ref 0.00–0.40)
Total Protein: 7.3 g/dL (ref 6.0–8.5)

## 2024-08-31 LAB — BASIC METABOLIC PANEL WITH GFR
BUN/Creatinine Ratio: 5 — ABNORMAL LOW (ref 12–28)
BUN: 9 mg/dL (ref 8–27)
CO2: 25 mmol/L (ref 20–29)
Calcium: 10.1 mg/dL (ref 8.7–10.3)
Chloride: 105 mmol/L (ref 96–106)
Creatinine, Ser: 1.73 mg/dL — ABNORMAL HIGH (ref 0.57–1.00)
Glucose: 93 mg/dL (ref 70–99)
Potassium: 4.2 mmol/L (ref 3.5–5.2)
Sodium: 141 mmol/L (ref 134–144)
eGFR: 31 mL/min/1.73 — ABNORMAL LOW

## 2024-08-31 LAB — LIPASE: Lipase: 15 U/L (ref 14–85)

## 2024-09-01 ENCOUNTER — Ambulatory Visit: Payer: Self-pay | Admitting: Family Medicine

## 2024-09-02 ENCOUNTER — Telehealth: Payer: Self-pay | Admitting: Family Medicine

## 2024-09-02 NOTE — Telephone Encounter (Signed)
 Attempted to call pt to tell labs are normal. Unable to leave VM. Letter sent to patient.  Rollene Keeling MD

## 2024-09-13 ENCOUNTER — Ambulatory Visit (HOSPITAL_COMMUNITY)
Admission: RE | Admit: 2024-09-13 | Discharge: 2024-09-13 | Disposition: A | Discharge status: Home / Self Care | Source: Ambulatory Visit | Attending: Family Medicine | Admitting: Family Medicine

## 2024-09-13 DIAGNOSIS — J439 Emphysema, unspecified: Secondary | ICD-10-CM | POA: Insufficient documentation

## 2024-09-13 DIAGNOSIS — F17210 Nicotine dependence, cigarettes, uncomplicated: Secondary | ICD-10-CM | POA: Diagnosis not present

## 2024-09-13 DIAGNOSIS — F17200 Nicotine dependence, unspecified, uncomplicated: Secondary | ICD-10-CM | POA: Diagnosis present

## 2024-09-13 DIAGNOSIS — M5412 Radiculopathy, cervical region: Secondary | ICD-10-CM | POA: Insufficient documentation

## 2024-09-13 DIAGNOSIS — Z122 Encounter for screening for malignant neoplasm of respiratory organs: Secondary | ICD-10-CM | POA: Insufficient documentation

## 2024-09-13 DIAGNOSIS — I2510 Atherosclerotic heart disease of native coronary artery without angina pectoris: Secondary | ICD-10-CM | POA: Insufficient documentation

## 2024-09-13 DIAGNOSIS — I700 Atherosclerosis of aorta: Secondary | ICD-10-CM | POA: Diagnosis not present

## 2024-09-13 DIAGNOSIS — M6281 Muscle weakness (generalized): Secondary | ICD-10-CM | POA: Diagnosis present

## 2024-09-22 ENCOUNTER — Telehealth: Payer: Self-pay | Admitting: Family Medicine

## 2024-09-22 ENCOUNTER — Encounter: Payer: Self-pay | Admitting: Family Medicine

## 2024-09-22 NOTE — Telephone Encounter (Signed)
 Hi Risk Manager,  Please schedule this patient for follow up with me for BP and neck pain follow up in 1-2 months   Thanks, Suzann Daring, MD  [facility] Medicine Teaching Service

## 2024-09-26 ENCOUNTER — Ambulatory Visit: Payer: Self-pay | Admitting: Family Medicine

## 2024-10-04 ENCOUNTER — Ambulatory Visit
Admission: RE | Admit: 2024-10-04 | Discharge: 2024-10-04 | Disposition: A | Source: Ambulatory Visit | Attending: Family Medicine

## 2024-10-04 DIAGNOSIS — Z1231 Encounter for screening mammogram for malignant neoplasm of breast: Secondary | ICD-10-CM

## 2024-10-15 ENCOUNTER — Other Ambulatory Visit: Payer: Self-pay | Admitting: Family Medicine

## 2024-10-15 DIAGNOSIS — R928 Other abnormal and inconclusive findings on diagnostic imaging of breast: Secondary | ICD-10-CM

## 2024-10-25 ENCOUNTER — Encounter

## 2024-10-25 ENCOUNTER — Other Ambulatory Visit
